# Patient Record
Sex: Male | Born: 1980
Health system: Southern US, Community
[De-identification: ages and names within clinical notes are randomized; demographics above are authoritative.]

## PROBLEM LIST (undated history)

## (undated) DIAGNOSIS — E079 Disorder of thyroid, unspecified: Secondary | ICD-10-CM

## (undated) DIAGNOSIS — F419 Anxiety disorder, unspecified: Secondary | ICD-10-CM

## (undated) DIAGNOSIS — F32A Depression, unspecified: Secondary | ICD-10-CM

## (undated) DIAGNOSIS — K219 Gastro-esophageal reflux disease without esophagitis: Secondary | ICD-10-CM

## (undated) DIAGNOSIS — B182 Chronic viral hepatitis C: Secondary | ICD-10-CM

## (undated) DIAGNOSIS — A048 Other specified bacterial intestinal infections: Secondary | ICD-10-CM

## (undated) DIAGNOSIS — F319 Bipolar disorder, unspecified: Secondary | ICD-10-CM

## (undated) DIAGNOSIS — E7800 Pure hypercholesterolemia, unspecified: Secondary | ICD-10-CM

## (undated) DIAGNOSIS — T50902A Poisoning by unspecified drugs, medicaments and biological substances, intentional self-harm, initial encounter: Secondary | ICD-10-CM

## (undated) DIAGNOSIS — C801 Malignant (primary) neoplasm, unspecified: Secondary | ICD-10-CM

## (undated) DIAGNOSIS — Z8709 Personal history of other diseases of the respiratory system: Secondary | ICD-10-CM

## (undated) DIAGNOSIS — F4310 Post-traumatic stress disorder, unspecified: Secondary | ICD-10-CM

## (undated) HISTORY — DX: Poisoning by unspecified drugs, medicaments and biological substances, intentional self-harm, initial encounter: T50.902A

## (undated) HISTORY — DX: Depression, unspecified: F32.A

## (undated) HISTORY — DX: Chronic viral hepatitis C: B18.2

## (undated) HISTORY — DX: Personal history of other diseases of the respiratory system: Z87.09

## (undated) HISTORY — DX: Bipolar disorder, unspecified: F31.9

## (undated) HISTORY — DX: Pure hypercholesterolemia, unspecified: E78.00

## (undated) HISTORY — DX: Gastro-esophageal reflux disease without esophagitis: K21.9

## (undated) HISTORY — DX: Other specified bacterial intestinal infections: A04.8

---

## 2001-05-07 ENCOUNTER — Inpatient Hospital Stay (HOSPITAL_COMMUNITY): Admission: EM | Admit: 2001-05-07 | Discharge: 2001-05-11 | Payer: Self-pay | Admitting: Psychiatry

## 2001-06-29 ENCOUNTER — Inpatient Hospital Stay (HOSPITAL_COMMUNITY): Admission: EM | Admit: 2001-06-29 | Discharge: 2001-07-06 | Payer: Self-pay | Admitting: Psychiatry

## 2001-08-04 ENCOUNTER — Inpatient Hospital Stay (HOSPITAL_COMMUNITY): Admission: EM | Admit: 2001-08-04 | Discharge: 2001-08-08 | Payer: Self-pay | Admitting: *Deleted

## 2001-08-07 ENCOUNTER — Encounter: Payer: Self-pay | Admitting: *Deleted

## 2010-11-19 HISTORY — PX: ESOPHAGOGASTRODUODENOSCOPY: SHX1529

## 2011-09-20 ENCOUNTER — Encounter (HOSPITAL_COMMUNITY): Payer: Self-pay | Admitting: Emergency Medicine

## 2011-09-20 ENCOUNTER — Emergency Department (HOSPITAL_COMMUNITY): Payer: No Typology Code available for payment source

## 2011-09-20 ENCOUNTER — Emergency Department (HOSPITAL_COMMUNITY)
Admission: EM | Admit: 2011-09-20 | Discharge: 2011-09-20 | Disposition: A | Discharge status: Home / Self Care | Payer: No Typology Code available for payment source | Attending: Emergency Medicine | Admitting: Emergency Medicine

## 2011-09-20 DIAGNOSIS — M545 Low back pain, unspecified: Secondary | ICD-10-CM | POA: Insufficient documentation

## 2011-09-20 DIAGNOSIS — Z79899 Other long term (current) drug therapy: Secondary | ICD-10-CM | POA: Insufficient documentation

## 2011-09-20 DIAGNOSIS — F17200 Nicotine dependence, unspecified, uncomplicated: Secondary | ICD-10-CM | POA: Insufficient documentation

## 2011-09-20 DIAGNOSIS — R413 Other amnesia: Secondary | ICD-10-CM | POA: Insufficient documentation

## 2011-09-20 DIAGNOSIS — R5383 Other fatigue: Secondary | ICD-10-CM | POA: Insufficient documentation

## 2011-09-20 DIAGNOSIS — M546 Pain in thoracic spine: Secondary | ICD-10-CM | POA: Insufficient documentation

## 2011-09-20 DIAGNOSIS — R5381 Other malaise: Secondary | ICD-10-CM | POA: Insufficient documentation

## 2011-09-20 DIAGNOSIS — M542 Cervicalgia: Secondary | ICD-10-CM | POA: Insufficient documentation

## 2011-09-20 DIAGNOSIS — F29 Unspecified psychosis not due to a substance or known physiological condition: Secondary | ICD-10-CM | POA: Insufficient documentation

## 2011-09-20 MED ORDER — OXYCODONE-ACETAMINOPHEN 5-325 MG PO TABS: 1.0000 | ORAL_TABLET | Freq: Four times a day (QID) | ORAL | Status: AC | PRN

## 2011-09-20 MED ORDER — HYDROCODONE-ACETAMINOPHEN 5-325 MG PO TABS
1.0000 | ORAL_TABLET | Freq: Once | ORAL | Status: AC
  Administered 2011-09-20: 1 via ORAL
  Filled 2011-09-20: qty 1
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
## 2011-09-20 NOTE — ED Notes (Signed)
Patient transported to X-ray 
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
## 2011-09-21 ENCOUNTER — Emergency Department (HOSPITAL_COMMUNITY): Payer: No Typology Code available for payment source

## 2011-09-21 ENCOUNTER — Encounter (HOSPITAL_COMMUNITY): Payer: Self-pay

## 2011-09-21 ENCOUNTER — Emergency Department (HOSPITAL_COMMUNITY)
Admission: EM | Admit: 2011-09-21 | Discharge: 2011-09-21 | Disposition: A | Discharge status: Home / Self Care | Payer: No Typology Code available for payment source | Attending: Emergency Medicine | Admitting: Emergency Medicine

## 2011-09-21 ENCOUNTER — Emergency Department (HOSPITAL_COMMUNITY)
Admission: EM | Admit: 2011-09-21 | Discharge: 2011-09-21 | Discharge status: Absent without leave | Payer: No Typology Code available for payment source | Source: Home / Self Care

## 2011-09-21 DIAGNOSIS — IMO0001 Reserved for inherently not codable concepts without codable children: Secondary | ICD-10-CM | POA: Insufficient documentation

## 2011-09-21 DIAGNOSIS — Z79899 Other long term (current) drug therapy: Secondary | ICD-10-CM | POA: Insufficient documentation

## 2011-09-21 DIAGNOSIS — Y92009 Unspecified place in unspecified non-institutional (private) residence as the place of occurrence of the external cause: Secondary | ICD-10-CM | POA: Insufficient documentation

## 2011-09-21 DIAGNOSIS — K8020 Calculus of gallbladder without cholecystitis without obstruction: Secondary | ICD-10-CM | POA: Insufficient documentation

## 2011-09-21 DIAGNOSIS — M545 Low back pain, unspecified: Secondary | ICD-10-CM

## 2011-09-21 DIAGNOSIS — M542 Cervicalgia: Secondary | ICD-10-CM | POA: Insufficient documentation

## 2011-09-21 DIAGNOSIS — F17200 Nicotine dependence, unspecified, uncomplicated: Secondary | ICD-10-CM | POA: Insufficient documentation

## 2011-09-21 DIAGNOSIS — R51 Headache: Secondary | ICD-10-CM | POA: Insufficient documentation

## 2011-09-21 DIAGNOSIS — R413 Other amnesia: Secondary | ICD-10-CM | POA: Insufficient documentation

## 2011-09-21 DIAGNOSIS — R42 Dizziness and giddiness: Secondary | ICD-10-CM | POA: Insufficient documentation

## 2011-09-21 DIAGNOSIS — W010XXA Fall on same level from slipping, tripping and stumbling without subsequent striking against object, initial encounter: Secondary | ICD-10-CM | POA: Insufficient documentation

## 2011-09-21 DIAGNOSIS — F0781 Postconcussional syndrome: Secondary | ICD-10-CM

## 2011-09-21 MED ORDER — OXYCODONE-ACETAMINOPHEN 5-325 MG PO TABS: 2.0000 | ORAL_TABLET | ORAL | Status: AC | PRN
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
## 2011-09-21 NOTE — Discharge Instructions (Signed)
Back Pain, Adult Low back pain is very common. About 1 in 5 people have back pain.The cause of low back pain is rarely dangerous. The pain often gets better over time.About half of people with a sudden onset of back pain feel better in just 2 weeks. About 8 in 10 people feel better by 6 weeks.  CAUSES Some common causes of back pain include:  Strain of the muscles or ligaments supporting the spine.   Wear and tear (degeneration) of the spinal discs.   Arthritis.   Direct injury to the back.  DIAGNOSIS Most of the time, the direct cause of low back pain is not known.However, back pain can be treated effectively even when the exact cause of the pain is unknown.Answering your caregiver's questions about your overall health and symptoms is one of the most accurate ways to make sure the cause of your pain is not dangerous. If your caregiver needs more information, he or she may order lab work or imaging tests (X-rays or MRIs).However, even if imaging tests show changes in your back, this usually does not require surgery. HOME CARE INSTRUCTIONS For many people, back pain returns.Since low back pain is rarely dangerous, it is often a condition that people can learn to manageon their own.   Remain active. It is stressful on the back to sit or stand in one place. Do not sit, drive, or stand in one place for more than 30 minutes at a time. Take short walks on level surfaces as soon as pain allows.Try to increase the length of time you walk each day.   Do not stay in bed.Resting more than 1 or 2 days can delay your recovery.   Do not avoid exercise or work.Your body is made to move.It is not dangerous to be active, even though your back may hurt.Your back will likely heal faster if you return to being active before your pain is gone.   Pay attention to your body when you bend and lift. Many people have less discomfortwhen lifting if they bend their knees, keep the load close to their  bodies,and avoid twisting. Often, the most comfortable positions are those that put less stress on your recovering back.   Find a comfortable position to sleep. Use a firm mattress and lie on your side with your knees slightly bent. If you lie on your back, put a pillow under your knees.   Only take over-the-counter or prescription medicines as directed by your caregiver. Over-the-counter medicines to reduce pain and inflammation are often the most helpful.Your caregiver may prescribe muscle relaxant drugs.These medicines help dull your pain so you can more quickly return to your normal activities and healthy exercise.   Put ice on the injured area.   Put ice in a plastic bag.   Place a towel between your skin and the bag.   Leave the ice on for 15 to 20 minutes, 3 to 4 times a day for the first 2 to 3 days. After that, ice and heat may be alternated to reduce pain and spasms.   Ask your caregiver about trying back exercises and gentle massage. This may be of some benefit.   Avoid feeling anxious or stressed.Stress increases muscle tension and can worsen back pain.It is important to recognize when you are anxious or stressed and learn ways to manage it.Exercise is a great option.  SEEK MEDICAL CARE IF:  You have pain that is not relieved with rest or medicine.   You have   pain that does not improve in 1 week.   You have new symptoms.   You are generally not feeling well.  SEEK IMMEDIATE MEDICAL CARE IF:   You have pain that radiates from your back into your legs.   You develop new bowel or bladder control problems.   You have unusual weakness or numbness in your arms or legs.   You develop nausea or vomiting.   You develop abdominal pain.   You feel faint.  Document Released: 06/20/2005 Document Revised: 06/09/2011 Document Reviewed: 11/08/2010 [employer] Patient Information 2012 [employer]. 

## 2011-09-21 NOTE — ED Provider Notes (Signed)
Medical screening examination/treatment/procedure(s) were performed by non-physician practitioner and as supervising physician I was immediately available for consultation/collaboration.  Laiah Pouncey L Artemis Koller, MD 09/21/11 1303 
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
## 2011-09-21 NOTE — ED Notes (Signed)
Patient transported to MRI 

## 2012-12-08 DIAGNOSIS — E7800 Pure hypercholesterolemia, unspecified: Secondary | ICD-10-CM | POA: Insufficient documentation

## 2012-12-08 DIAGNOSIS — F411 Generalized anxiety disorder: Secondary | ICD-10-CM | POA: Insufficient documentation

## 2013-01-03 ENCOUNTER — Encounter (HOSPITAL_COMMUNITY): Payer: Self-pay | Admitting: Emergency Medicine

## 2013-01-03 ENCOUNTER — Emergency Department (HOSPITAL_COMMUNITY)
Admission: EM | Admit: 2013-01-03 | Discharge: 2013-01-03 | Disposition: A | Discharge status: Home / Self Care | Payer: Medicare Other | Attending: Emergency Medicine | Admitting: Emergency Medicine

## 2013-01-03 DIAGNOSIS — G8929 Other chronic pain: Secondary | ICD-10-CM | POA: Insufficient documentation

## 2013-01-03 DIAGNOSIS — E079 Disorder of thyroid, unspecified: Secondary | ICD-10-CM | POA: Insufficient documentation

## 2013-01-03 DIAGNOSIS — Z79899 Other long term (current) drug therapy: Secondary | ICD-10-CM | POA: Insufficient documentation

## 2013-01-03 DIAGNOSIS — L255 Unspecified contact dermatitis due to plants, except food: Secondary | ICD-10-CM | POA: Insufficient documentation

## 2013-01-03 DIAGNOSIS — F17200 Nicotine dependence, unspecified, uncomplicated: Secondary | ICD-10-CM | POA: Insufficient documentation

## 2013-01-03 DIAGNOSIS — L237 Allergic contact dermatitis due to plants, except food: Secondary | ICD-10-CM

## 2013-01-03 DIAGNOSIS — F329 Major depressive disorder, single episode, unspecified: Secondary | ICD-10-CM | POA: Insufficient documentation

## 2013-01-03 DIAGNOSIS — M549 Dorsalgia, unspecified: Secondary | ICD-10-CM | POA: Insufficient documentation

## 2013-01-03 DIAGNOSIS — IMO0002 Reserved for concepts with insufficient information to code with codable children: Secondary | ICD-10-CM | POA: Insufficient documentation

## 2013-01-03 DIAGNOSIS — F4310 Post-traumatic stress disorder, unspecified: Secondary | ICD-10-CM | POA: Insufficient documentation

## 2013-01-03 DIAGNOSIS — F411 Generalized anxiety disorder: Secondary | ICD-10-CM | POA: Insufficient documentation

## 2013-01-03 DIAGNOSIS — F3289 Other specified depressive episodes: Secondary | ICD-10-CM | POA: Insufficient documentation

## 2013-01-03 HISTORY — DX: Disorder of thyroid, unspecified: E07.9

## 2013-01-03 HISTORY — DX: Post-traumatic stress disorder, unspecified: F43.10

## 2013-01-03 HISTORY — DX: Anxiety disorder, unspecified: F41.9

## 2013-01-03 MED ORDER — DIPHENHYDRAMINE HCL 25 MG PO TABS: 25.0000 mg | ORAL_TABLET | Freq: Four times a day (QID) | ORAL | Status: DC

## 2013-01-03 MED ORDER — PREDNISONE 10 MG PO TABS: 20.0000 mg | ORAL_TABLET | Freq: Two times a day (BID) | ORAL | Status: DC

## 2013-01-03 NOTE — ED Notes (Signed)
Pt reports rash to back area for 2 days,  Then to bil arms off/on for 1 month.  Stated has not been to his pmd for treatment of rash in the last month.   Denies any new lotions/soaps etc.  Pt is unsure of cause. Pt asked how long it would be until he was seen and if the ed was busy, he had a aunt who is sick upstairs.

## 2013-01-03 NOTE — ED Provider Notes (Signed)
Medical screening examination/treatment/procedure(s) were performed by non-physician practitioner and as supervising physician I was immediately available for consultation/collaboration.   Isabeau Mccalla B. Bernette Mayers, MD 01/03/13 1141
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
## 2013-01-03 NOTE — ED Notes (Signed)
States that he noticed a rash on his left arm and back several days ago.

## 2014-12-26 DIAGNOSIS — F39 Unspecified mood [affective] disorder: Secondary | ICD-10-CM | POA: Insufficient documentation

## 2015-07-21 DIAGNOSIS — R5383 Other fatigue: Secondary | ICD-10-CM | POA: Diagnosis not present

## 2015-07-21 DIAGNOSIS — B1920 Unspecified viral hepatitis C without hepatic coma: Secondary | ICD-10-CM | POA: Diagnosis not present

## 2015-07-21 DIAGNOSIS — F419 Anxiety disorder, unspecified: Secondary | ICD-10-CM | POA: Diagnosis not present

## 2015-07-21 DIAGNOSIS — E039 Hypothyroidism, unspecified: Secondary | ICD-10-CM | POA: Diagnosis not present

## 2015-07-21 DIAGNOSIS — G894 Chronic pain syndrome: Secondary | ICD-10-CM | POA: Diagnosis not present

## 2015-09-01 DIAGNOSIS — B182 Chronic viral hepatitis C: Secondary | ICD-10-CM | POA: Diagnosis not present

## 2015-10-12 DIAGNOSIS — J302 Other seasonal allergic rhinitis: Secondary | ICD-10-CM | POA: Diagnosis not present

## 2015-10-12 DIAGNOSIS — R51 Headache: Secondary | ICD-10-CM | POA: Diagnosis not present

## 2015-10-12 DIAGNOSIS — F419 Anxiety disorder, unspecified: Secondary | ICD-10-CM | POA: Diagnosis not present

## 2015-10-12 DIAGNOSIS — G894 Chronic pain syndrome: Secondary | ICD-10-CM | POA: Diagnosis not present

## 2015-10-16 DIAGNOSIS — S8980XA Other specified injuries of unspecified lower leg, initial encounter: Secondary | ICD-10-CM | POA: Diagnosis not present

## 2015-10-16 DIAGNOSIS — R51 Headache: Secondary | ICD-10-CM | POA: Diagnosis not present

## 2015-10-16 DIAGNOSIS — M545 Low back pain: Secondary | ICD-10-CM | POA: Diagnosis not present

## 2015-10-16 DIAGNOSIS — M542 Cervicalgia: Secondary | ICD-10-CM | POA: Diagnosis not present

## 2015-10-16 DIAGNOSIS — M25562 Pain in left knee: Secondary | ICD-10-CM | POA: Diagnosis not present

## 2016-01-28 DIAGNOSIS — B182 Chronic viral hepatitis C: Secondary | ICD-10-CM | POA: Diagnosis not present

## 2016-02-01 DIAGNOSIS — F4310 Post-traumatic stress disorder, unspecified: Secondary | ICD-10-CM | POA: Diagnosis not present

## 2016-02-01 DIAGNOSIS — Z79899 Other long term (current) drug therapy: Secondary | ICD-10-CM | POA: Diagnosis not present

## 2016-02-03 ENCOUNTER — Other Ambulatory Visit (HOSPITAL_COMMUNITY): Payer: Self-pay | Admitting: Gastroenterology

## 2016-02-03 DIAGNOSIS — B182 Chronic viral hepatitis C: Secondary | ICD-10-CM

## 2016-02-08 DIAGNOSIS — Z23 Encounter for immunization: Secondary | ICD-10-CM | POA: Diagnosis not present

## 2016-02-22 DIAGNOSIS — G894 Chronic pain syndrome: Secondary | ICD-10-CM | POA: Diagnosis not present

## 2016-02-22 DIAGNOSIS — F419 Anxiety disorder, unspecified: Secondary | ICD-10-CM | POA: Diagnosis not present

## 2016-02-23 DIAGNOSIS — F4310 Post-traumatic stress disorder, unspecified: Secondary | ICD-10-CM | POA: Diagnosis not present

## 2016-02-23 DIAGNOSIS — Z79899 Other long term (current) drug therapy: Secondary | ICD-10-CM | POA: Diagnosis not present

## 2016-03-15 ENCOUNTER — Ambulatory Visit (HOSPITAL_COMMUNITY): Payer: Medicare Other

## 2016-03-28 DIAGNOSIS — G894 Chronic pain syndrome: Secondary | ICD-10-CM | POA: Diagnosis not present

## 2016-04-04 DIAGNOSIS — F4310 Post-traumatic stress disorder, unspecified: Secondary | ICD-10-CM | POA: Diagnosis not present

## 2016-04-04 DIAGNOSIS — Z79899 Other long term (current) drug therapy: Secondary | ICD-10-CM | POA: Diagnosis not present

## 2016-04-27 ENCOUNTER — Ambulatory Visit (HOSPITAL_COMMUNITY)
Admission: RE | Admit: 2016-04-27 | Discharge: 2016-04-27 | Disposition: A | Discharge status: Home / Self Care | Payer: Medicare Other | Source: Ambulatory Visit | Attending: Gastroenterology | Admitting: Gastroenterology

## 2016-04-27 DIAGNOSIS — K8020 Calculus of gallbladder without cholecystitis without obstruction: Secondary | ICD-10-CM | POA: Diagnosis not present

## 2016-04-27 DIAGNOSIS — B182 Chronic viral hepatitis C: Secondary | ICD-10-CM | POA: Diagnosis not present

## 2016-04-27 DIAGNOSIS — B1920 Unspecified viral hepatitis C without hepatic coma: Secondary | ICD-10-CM | POA: Diagnosis not present

## 2016-05-03 DIAGNOSIS — K8020 Calculus of gallbladder without cholecystitis without obstruction: Secondary | ICD-10-CM | POA: Diagnosis not present

## 2016-05-03 DIAGNOSIS — B182 Chronic viral hepatitis C: Secondary | ICD-10-CM | POA: Diagnosis not present

## 2016-05-05 DIAGNOSIS — F4310 Post-traumatic stress disorder, unspecified: Secondary | ICD-10-CM | POA: Diagnosis not present

## 2016-05-05 DIAGNOSIS — Z79899 Other long term (current) drug therapy: Secondary | ICD-10-CM | POA: Diagnosis not present

## 2016-05-06 DIAGNOSIS — Z79899 Other long term (current) drug therapy: Secondary | ICD-10-CM | POA: Diagnosis not present

## 2016-05-06 DIAGNOSIS — F4310 Post-traumatic stress disorder, unspecified: Secondary | ICD-10-CM | POA: Diagnosis not present

## 2016-05-12 DIAGNOSIS — F4310 Post-traumatic stress disorder, unspecified: Secondary | ICD-10-CM | POA: Diagnosis not present

## 2016-05-12 DIAGNOSIS — Z79899 Other long term (current) drug therapy: Secondary | ICD-10-CM | POA: Diagnosis not present

## 2016-05-26 DIAGNOSIS — F329 Major depressive disorder, single episode, unspecified: Secondary | ICD-10-CM | POA: Diagnosis not present

## 2016-06-03 DIAGNOSIS — F4310 Post-traumatic stress disorder, unspecified: Secondary | ICD-10-CM | POA: Diagnosis not present

## 2016-06-03 DIAGNOSIS — Z79899 Other long term (current) drug therapy: Secondary | ICD-10-CM | POA: Diagnosis not present

## 2016-06-15 DIAGNOSIS — R112 Nausea with vomiting, unspecified: Secondary | ICD-10-CM | POA: Diagnosis not present

## 2016-06-15 DIAGNOSIS — R197 Diarrhea, unspecified: Secondary | ICD-10-CM | POA: Diagnosis not present

## 2016-06-15 DIAGNOSIS — B349 Viral infection, unspecified: Secondary | ICD-10-CM | POA: Diagnosis not present

## 2016-08-08 DIAGNOSIS — Z23 Encounter for immunization: Secondary | ICD-10-CM | POA: Diagnosis not present

## 2016-09-14 DIAGNOSIS — T50904A Poisoning by unspecified drugs, medicaments and biological substances, undetermined, initial encounter: Secondary | ICD-10-CM | POA: Diagnosis not present

## 2016-09-15 DIAGNOSIS — F1210 Cannabis abuse, uncomplicated: Secondary | ICD-10-CM | POA: Insufficient documentation

## 2016-09-15 DIAGNOSIS — F1010 Alcohol abuse, uncomplicated: Secondary | ICD-10-CM | POA: Insufficient documentation

## 2016-10-18 DIAGNOSIS — F319 Bipolar disorder, unspecified: Secondary | ICD-10-CM | POA: Diagnosis not present

## 2016-10-18 DIAGNOSIS — M1991 Primary osteoarthritis, unspecified site: Secondary | ICD-10-CM | POA: Diagnosis not present

## 2016-10-18 DIAGNOSIS — G894 Chronic pain syndrome: Secondary | ICD-10-CM | POA: Diagnosis not present

## 2016-10-18 DIAGNOSIS — F419 Anxiety disorder, unspecified: Secondary | ICD-10-CM | POA: Diagnosis not present

## 2016-10-18 DIAGNOSIS — K219 Gastro-esophageal reflux disease without esophagitis: Secondary | ICD-10-CM | POA: Diagnosis not present

## 2016-10-27 DIAGNOSIS — T1491XA Suicide attempt, initial encounter: Secondary | ICD-10-CM | POA: Diagnosis not present

## 2016-10-27 DIAGNOSIS — F4310 Post-traumatic stress disorder, unspecified: Secondary | ICD-10-CM | POA: Diagnosis not present

## 2016-10-27 DIAGNOSIS — F321 Major depressive disorder, single episode, moderate: Secondary | ICD-10-CM | POA: Diagnosis not present

## 2016-10-27 DIAGNOSIS — R45851 Suicidal ideations: Secondary | ICD-10-CM | POA: Diagnosis not present

## 2016-10-27 DIAGNOSIS — S41109A Unspecified open wound of unspecified upper arm, initial encounter: Secondary | ICD-10-CM | POA: Diagnosis not present

## 2016-10-27 DIAGNOSIS — F29 Unspecified psychosis not due to a substance or known physiological condition: Secondary | ICD-10-CM | POA: Diagnosis not present

## 2016-10-27 DIAGNOSIS — S61512A Laceration without foreign body of left wrist, initial encounter: Secondary | ICD-10-CM | POA: Diagnosis not present

## 2016-11-15 DIAGNOSIS — G894 Chronic pain syndrome: Secondary | ICD-10-CM | POA: Diagnosis not present

## 2016-11-15 DIAGNOSIS — M1991 Primary osteoarthritis, unspecified site: Secondary | ICD-10-CM | POA: Diagnosis not present

## 2016-11-15 DIAGNOSIS — G44029 Chronic cluster headache, not intractable: Secondary | ICD-10-CM | POA: Diagnosis not present

## 2016-11-15 DIAGNOSIS — F419 Anxiety disorder, unspecified: Secondary | ICD-10-CM | POA: Diagnosis not present

## 2016-11-17 DIAGNOSIS — G44029 Chronic cluster headache, not intractable: Secondary | ICD-10-CM | POA: Diagnosis not present

## 2016-11-17 DIAGNOSIS — M1380 Other specified arthritis, unspecified site: Secondary | ICD-10-CM | POA: Diagnosis not present

## 2016-11-17 DIAGNOSIS — F419 Anxiety disorder, unspecified: Secondary | ICD-10-CM | POA: Diagnosis not present

## 2016-11-17 DIAGNOSIS — F902 Attention-deficit hyperactivity disorder, combined type: Secondary | ICD-10-CM | POA: Diagnosis not present

## 2017-05-09 DIAGNOSIS — G894 Chronic pain syndrome: Secondary | ICD-10-CM | POA: Diagnosis not present

## 2017-06-22 DIAGNOSIS — S61212S Laceration without foreign body of right middle finger without damage to nail, sequela: Secondary | ICD-10-CM | POA: Diagnosis not present

## 2017-08-17 DIAGNOSIS — R200 Anesthesia of skin: Secondary | ICD-10-CM | POA: Diagnosis not present

## 2017-08-23 DIAGNOSIS — G5603 Carpal tunnel syndrome, bilateral upper limbs: Secondary | ICD-10-CM | POA: Diagnosis not present

## 2017-08-31 DIAGNOSIS — Z79899 Other long term (current) drug therapy: Secondary | ICD-10-CM | POA: Diagnosis not present

## 2017-08-31 DIAGNOSIS — F3181 Bipolar II disorder: Secondary | ICD-10-CM | POA: Diagnosis not present

## 2017-08-31 DIAGNOSIS — G894 Chronic pain syndrome: Secondary | ICD-10-CM | POA: Diagnosis not present

## 2017-08-31 DIAGNOSIS — G5602 Carpal tunnel syndrome, left upper limb: Secondary | ICD-10-CM | POA: Diagnosis not present

## 2017-08-31 DIAGNOSIS — G5601 Carpal tunnel syndrome, right upper limb: Secondary | ICD-10-CM | POA: Diagnosis not present

## 2017-08-31 DIAGNOSIS — F900 Attention-deficit hyperactivity disorder, predominantly inattentive type: Secondary | ICD-10-CM | POA: Diagnosis not present

## 2017-08-31 DIAGNOSIS — Z79891 Long term (current) use of opiate analgesic: Secondary | ICD-10-CM | POA: Diagnosis not present

## 2017-08-31 DIAGNOSIS — M5417 Radiculopathy, lumbosacral region: Secondary | ICD-10-CM | POA: Diagnosis not present

## 2017-08-31 DIAGNOSIS — F411 Generalized anxiety disorder: Secondary | ICD-10-CM | POA: Diagnosis not present

## 2017-09-14 DIAGNOSIS — F411 Generalized anxiety disorder: Secondary | ICD-10-CM | POA: Diagnosis not present

## 2017-09-14 DIAGNOSIS — Z79891 Long term (current) use of opiate analgesic: Secondary | ICD-10-CM | POA: Diagnosis not present

## 2017-09-14 DIAGNOSIS — G894 Chronic pain syndrome: Secondary | ICD-10-CM | POA: Diagnosis not present

## 2017-09-14 DIAGNOSIS — M5417 Radiculopathy, lumbosacral region: Secondary | ICD-10-CM | POA: Diagnosis not present

## 2017-09-14 DIAGNOSIS — F900 Attention-deficit hyperactivity disorder, predominantly inattentive type: Secondary | ICD-10-CM | POA: Diagnosis not present

## 2017-09-14 DIAGNOSIS — G5601 Carpal tunnel syndrome, right upper limb: Secondary | ICD-10-CM | POA: Diagnosis not present

## 2017-09-14 DIAGNOSIS — G5602 Carpal tunnel syndrome, left upper limb: Secondary | ICD-10-CM | POA: Diagnosis not present

## 2017-09-14 DIAGNOSIS — F3181 Bipolar II disorder: Secondary | ICD-10-CM | POA: Diagnosis not present

## 2017-09-22 DIAGNOSIS — F310 Bipolar disorder, current episode hypomanic: Secondary | ICD-10-CM | POA: Diagnosis not present

## 2017-09-22 DIAGNOSIS — Z79899 Other long term (current) drug therapy: Secondary | ICD-10-CM | POA: Diagnosis not present

## 2017-09-27 DIAGNOSIS — R200 Anesthesia of skin: Secondary | ICD-10-CM | POA: Diagnosis not present

## 2017-09-27 DIAGNOSIS — G5602 Carpal tunnel syndrome, left upper limb: Secondary | ICD-10-CM | POA: Diagnosis not present

## 2017-10-05 DIAGNOSIS — M5417 Radiculopathy, lumbosacral region: Secondary | ICD-10-CM | POA: Diagnosis not present

## 2017-10-05 DIAGNOSIS — F3181 Bipolar II disorder: Secondary | ICD-10-CM | POA: Diagnosis not present

## 2017-10-05 DIAGNOSIS — G894 Chronic pain syndrome: Secondary | ICD-10-CM | POA: Diagnosis not present

## 2017-10-05 DIAGNOSIS — G5601 Carpal tunnel syndrome, right upper limb: Secondary | ICD-10-CM | POA: Diagnosis not present

## 2017-10-05 DIAGNOSIS — F411 Generalized anxiety disorder: Secondary | ICD-10-CM | POA: Diagnosis not present

## 2017-10-05 DIAGNOSIS — F900 Attention-deficit hyperactivity disorder, predominantly inattentive type: Secondary | ICD-10-CM | POA: Diagnosis not present

## 2017-10-05 DIAGNOSIS — G5602 Carpal tunnel syndrome, left upper limb: Secondary | ICD-10-CM | POA: Diagnosis not present

## 2017-10-05 DIAGNOSIS — Z79891 Long term (current) use of opiate analgesic: Secondary | ICD-10-CM | POA: Diagnosis not present

## 2017-10-06 DIAGNOSIS — F319 Bipolar disorder, unspecified: Secondary | ICD-10-CM | POA: Diagnosis not present

## 2017-10-06 DIAGNOSIS — G5602 Carpal tunnel syndrome, left upper limb: Secondary | ICD-10-CM | POA: Diagnosis not present

## 2017-10-06 DIAGNOSIS — F17290 Nicotine dependence, other tobacco product, uncomplicated: Secondary | ICD-10-CM | POA: Diagnosis not present

## 2017-10-06 DIAGNOSIS — Z79899 Other long term (current) drug therapy: Secondary | ICD-10-CM | POA: Diagnosis not present

## 2017-10-06 DIAGNOSIS — F909 Attention-deficit hyperactivity disorder, unspecified type: Secondary | ICD-10-CM | POA: Diagnosis not present

## 2017-10-06 DIAGNOSIS — F419 Anxiety disorder, unspecified: Secondary | ICD-10-CM | POA: Diagnosis not present

## 2017-10-06 DIAGNOSIS — E079 Disorder of thyroid, unspecified: Secondary | ICD-10-CM | POA: Diagnosis not present

## 2017-10-13 DIAGNOSIS — G5602 Carpal tunnel syndrome, left upper limb: Secondary | ICD-10-CM | POA: Diagnosis not present

## 2017-10-13 DIAGNOSIS — F411 Generalized anxiety disorder: Secondary | ICD-10-CM | POA: Diagnosis not present

## 2017-10-13 DIAGNOSIS — G894 Chronic pain syndrome: Secondary | ICD-10-CM | POA: Diagnosis not present

## 2017-10-13 DIAGNOSIS — F900 Attention-deficit hyperactivity disorder, predominantly inattentive type: Secondary | ICD-10-CM | POA: Diagnosis not present

## 2017-10-13 DIAGNOSIS — G5601 Carpal tunnel syndrome, right upper limb: Secondary | ICD-10-CM | POA: Diagnosis not present

## 2017-10-13 DIAGNOSIS — F3181 Bipolar II disorder: Secondary | ICD-10-CM | POA: Diagnosis not present

## 2017-10-13 DIAGNOSIS — Z79891 Long term (current) use of opiate analgesic: Secondary | ICD-10-CM | POA: Diagnosis not present

## 2017-10-13 DIAGNOSIS — M5417 Radiculopathy, lumbosacral region: Secondary | ICD-10-CM | POA: Diagnosis not present

## 2017-10-16 DIAGNOSIS — G894 Chronic pain syndrome: Secondary | ICD-10-CM | POA: Diagnosis not present

## 2017-10-16 DIAGNOSIS — Z79891 Long term (current) use of opiate analgesic: Secondary | ICD-10-CM | POA: Diagnosis not present

## 2017-10-16 DIAGNOSIS — F900 Attention-deficit hyperactivity disorder, predominantly inattentive type: Secondary | ICD-10-CM | POA: Diagnosis not present

## 2017-10-16 DIAGNOSIS — F411 Generalized anxiety disorder: Secondary | ICD-10-CM | POA: Diagnosis not present

## 2017-10-16 DIAGNOSIS — G5601 Carpal tunnel syndrome, right upper limb: Secondary | ICD-10-CM | POA: Diagnosis not present

## 2017-10-16 DIAGNOSIS — G5602 Carpal tunnel syndrome, left upper limb: Secondary | ICD-10-CM | POA: Diagnosis not present

## 2017-10-16 DIAGNOSIS — M5417 Radiculopathy, lumbosacral region: Secondary | ICD-10-CM | POA: Diagnosis not present

## 2017-10-16 DIAGNOSIS — F3181 Bipolar II disorder: Secondary | ICD-10-CM | POA: Diagnosis not present

## 2017-11-30 DIAGNOSIS — F988 Other specified behavioral and emotional disorders with onset usually occurring in childhood and adolescence: Secondary | ICD-10-CM | POA: Diagnosis not present

## 2017-11-30 DIAGNOSIS — Z1331 Encounter for screening for depression: Secondary | ICD-10-CM | POA: Diagnosis not present

## 2017-11-30 DIAGNOSIS — Z6825 Body mass index (BMI) 25.0-25.9, adult: Secondary | ICD-10-CM | POA: Diagnosis not present

## 2017-12-31 DIAGNOSIS — S40022A Contusion of left upper arm, initial encounter: Secondary | ICD-10-CM | POA: Diagnosis not present

## 2017-12-31 DIAGNOSIS — R51 Headache: Secondary | ICD-10-CM | POA: Diagnosis not present

## 2017-12-31 DIAGNOSIS — F17210 Nicotine dependence, cigarettes, uncomplicated: Secondary | ICD-10-CM | POA: Diagnosis not present

## 2017-12-31 DIAGNOSIS — Z79899 Other long term (current) drug therapy: Secondary | ICD-10-CM | POA: Diagnosis not present

## 2017-12-31 DIAGNOSIS — S0990XA Unspecified injury of head, initial encounter: Secondary | ICD-10-CM | POA: Diagnosis not present

## 2017-12-31 DIAGNOSIS — J45909 Unspecified asthma, uncomplicated: Secondary | ICD-10-CM | POA: Diagnosis not present

## 2017-12-31 DIAGNOSIS — T7411XA Adult physical abuse, confirmed, initial encounter: Secondary | ICD-10-CM | POA: Diagnosis not present

## 2018-02-06 DIAGNOSIS — R52 Pain, unspecified: Secondary | ICD-10-CM | POA: Diagnosis not present

## 2018-02-06 DIAGNOSIS — R0602 Shortness of breath: Secondary | ICD-10-CM | POA: Diagnosis not present

## 2018-02-06 DIAGNOSIS — F1910 Other psychoactive substance abuse, uncomplicated: Secondary | ICD-10-CM

## 2018-02-06 DIAGNOSIS — I38 Endocarditis, valve unspecified: Secondary | ICD-10-CM | POA: Diagnosis not present

## 2018-02-06 DIAGNOSIS — E162 Hypoglycemia, unspecified: Secondary | ICD-10-CM | POA: Diagnosis not present

## 2018-02-06 DIAGNOSIS — G9341 Metabolic encephalopathy: Secondary | ICD-10-CM

## 2018-02-06 DIAGNOSIS — R0902 Hypoxemia: Secondary | ICD-10-CM | POA: Diagnosis not present

## 2018-02-06 DIAGNOSIS — T50904A Poisoning by unspecified drugs, medicaments and biological substances, undetermined, initial encounter: Secondary | ICD-10-CM | POA: Diagnosis not present

## 2018-02-06 DIAGNOSIS — R404 Transient alteration of awareness: Secondary | ICD-10-CM | POA: Diagnosis not present

## 2018-02-06 DIAGNOSIS — T68XXXA Hypothermia, initial encounter: Secondary | ICD-10-CM | POA: Diagnosis not present

## 2018-02-06 DIAGNOSIS — R4020 Unspecified coma: Secondary | ICD-10-CM | POA: Diagnosis not present

## 2018-02-06 DIAGNOSIS — T50901A Poisoning by unspecified drugs, medicaments and biological substances, accidental (unintentional), initial encounter: Secondary | ICD-10-CM | POA: Diagnosis not present

## 2018-02-06 DIAGNOSIS — R4182 Altered mental status, unspecified: Secondary | ICD-10-CM | POA: Diagnosis not present

## 2018-02-06 DIAGNOSIS — T40601A Poisoning by unspecified narcotics, accidental (unintentional), initial encounter: Secondary | ICD-10-CM | POA: Diagnosis not present

## 2018-02-06 DIAGNOSIS — I959 Hypotension, unspecified: Secondary | ICD-10-CM | POA: Diagnosis not present

## 2018-02-07 DIAGNOSIS — F1910 Other psychoactive substance abuse, uncomplicated: Secondary | ICD-10-CM | POA: Diagnosis not present

## 2018-02-07 DIAGNOSIS — G9341 Metabolic encephalopathy: Secondary | ICD-10-CM | POA: Diagnosis not present

## 2018-02-07 DIAGNOSIS — I425 Other restrictive cardiomyopathy: Secondary | ICD-10-CM | POA: Diagnosis not present

## 2018-02-08 DIAGNOSIS — I5020 Unspecified systolic (congestive) heart failure: Secondary | ICD-10-CM | POA: Diagnosis not present

## 2018-02-08 DIAGNOSIS — G92 Toxic encephalopathy: Secondary | ICD-10-CM | POA: Diagnosis not present

## 2018-02-08 DIAGNOSIS — I2721 Secondary pulmonary arterial hypertension: Secondary | ICD-10-CM | POA: Diagnosis not present

## 2018-02-08 DIAGNOSIS — F1910 Other psychoactive substance abuse, uncomplicated: Secondary | ICD-10-CM | POA: Diagnosis not present

## 2018-02-08 DIAGNOSIS — T50904A Poisoning by unspecified drugs, medicaments and biological substances, undetermined, initial encounter: Secondary | ICD-10-CM | POA: Diagnosis not present

## 2018-02-08 DIAGNOSIS — T40601A Poisoning by unspecified narcotics, accidental (unintentional), initial encounter: Secondary | ICD-10-CM | POA: Diagnosis not present

## 2018-03-24 DIAGNOSIS — K610 Anal abscess: Secondary | ICD-10-CM | POA: Diagnosis not present

## 2018-03-24 DIAGNOSIS — K611 Rectal abscess: Secondary | ICD-10-CM | POA: Diagnosis not present

## 2018-03-24 DIAGNOSIS — R5381 Other malaise: Secondary | ICD-10-CM | POA: Diagnosis not present

## 2018-03-24 DIAGNOSIS — L02415 Cutaneous abscess of right lower limb: Secondary | ICD-10-CM | POA: Diagnosis not present

## 2018-03-24 DIAGNOSIS — F17210 Nicotine dependence, cigarettes, uncomplicated: Secondary | ICD-10-CM | POA: Diagnosis not present

## 2018-04-18 DIAGNOSIS — J45909 Unspecified asthma, uncomplicated: Secondary | ICD-10-CM | POA: Diagnosis not present

## 2018-04-18 DIAGNOSIS — M79671 Pain in right foot: Secondary | ICD-10-CM | POA: Diagnosis not present

## 2018-04-18 DIAGNOSIS — Z79891 Long term (current) use of opiate analgesic: Secondary | ICD-10-CM | POA: Diagnosis not present

## 2018-04-18 DIAGNOSIS — M25571 Pain in right ankle and joints of right foot: Secondary | ICD-10-CM | POA: Diagnosis not present

## 2018-04-18 DIAGNOSIS — S99911A Unspecified injury of right ankle, initial encounter: Secondary | ICD-10-CM | POA: Diagnosis not present

## 2018-04-18 DIAGNOSIS — S99921A Unspecified injury of right foot, initial encounter: Secondary | ICD-10-CM | POA: Diagnosis not present

## 2018-04-18 DIAGNOSIS — E039 Hypothyroidism, unspecified: Secondary | ICD-10-CM | POA: Diagnosis not present

## 2018-04-18 DIAGNOSIS — Z79899 Other long term (current) drug therapy: Secondary | ICD-10-CM | POA: Diagnosis not present

## 2018-04-18 DIAGNOSIS — F17210 Nicotine dependence, cigarettes, uncomplicated: Secondary | ICD-10-CM | POA: Diagnosis not present

## 2018-04-18 DIAGNOSIS — G8929 Other chronic pain: Secondary | ICD-10-CM | POA: Diagnosis not present

## 2018-04-19 DIAGNOSIS — M25571 Pain in right ankle and joints of right foot: Secondary | ICD-10-CM | POA: Diagnosis not present

## 2018-05-04 DIAGNOSIS — E039 Hypothyroidism, unspecified: Secondary | ICD-10-CM | POA: Diagnosis not present

## 2018-05-04 DIAGNOSIS — M1990 Unspecified osteoarthritis, unspecified site: Secondary | ICD-10-CM | POA: Diagnosis not present

## 2018-07-09 DIAGNOSIS — Z6825 Body mass index (BMI) 25.0-25.9, adult: Secondary | ICD-10-CM | POA: Diagnosis not present

## 2018-07-09 DIAGNOSIS — F988 Other specified behavioral and emotional disorders with onset usually occurring in childhood and adolescence: Secondary | ICD-10-CM | POA: Diagnosis not present

## 2018-07-09 DIAGNOSIS — F411 Generalized anxiety disorder: Secondary | ICD-10-CM | POA: Diagnosis not present

## 2018-09-04 DIAGNOSIS — F988 Other specified behavioral and emotional disorders with onset usually occurring in childhood and adolescence: Secondary | ICD-10-CM | POA: Diagnosis not present

## 2018-09-04 DIAGNOSIS — Z6825 Body mass index (BMI) 25.0-25.9, adult: Secondary | ICD-10-CM | POA: Diagnosis not present

## 2018-09-04 DIAGNOSIS — R51 Headache: Secondary | ICD-10-CM | POA: Diagnosis not present

## 2018-09-04 DIAGNOSIS — Z23 Encounter for immunization: Secondary | ICD-10-CM | POA: Diagnosis not present

## 2018-09-20 DIAGNOSIS — Z79899 Other long term (current) drug therapy: Secondary | ICD-10-CM | POA: Diagnosis not present

## 2018-09-20 DIAGNOSIS — Z6825 Body mass index (BMI) 25.0-25.9, adult: Secondary | ICD-10-CM | POA: Diagnosis not present

## 2018-12-13 ENCOUNTER — Other Ambulatory Visit: Payer: Self-pay

## 2018-12-13 ENCOUNTER — Emergency Department (HOSPITAL_COMMUNITY)
Admission: EM | Admit: 2018-12-13 | Discharge: 2018-12-13 | Disposition: A | Discharge status: Home / Self Care | Payer: Medicare Other | Attending: Emergency Medicine | Admitting: Emergency Medicine

## 2018-12-13 DIAGNOSIS — X010XXA Exposure to flames in uncontrolled fire, not in building or structure, initial encounter: Secondary | ICD-10-CM | POA: Insufficient documentation

## 2018-12-13 DIAGNOSIS — T22131A Burn of first degree of right upper arm, initial encounter: Secondary | ICD-10-CM | POA: Diagnosis not present

## 2018-12-13 DIAGNOSIS — Y92017 Garden or yard in single-family (private) house as the place of occurrence of the external cause: Secondary | ICD-10-CM | POA: Insufficient documentation

## 2018-12-13 DIAGNOSIS — Y93H2 Activity, gardening and landscaping: Secondary | ICD-10-CM | POA: Insufficient documentation

## 2018-12-13 DIAGNOSIS — F17210 Nicotine dependence, cigarettes, uncomplicated: Secondary | ICD-10-CM | POA: Insufficient documentation

## 2018-12-13 DIAGNOSIS — T22111A Burn of first degree of right forearm, initial encounter: Secondary | ICD-10-CM | POA: Insufficient documentation

## 2018-12-13 DIAGNOSIS — R52 Pain, unspecified: Secondary | ICD-10-CM | POA: Diagnosis not present

## 2018-12-13 DIAGNOSIS — Y999 Unspecified external cause status: Secondary | ICD-10-CM | POA: Insufficient documentation

## 2018-12-13 DIAGNOSIS — R609 Edema, unspecified: Secondary | ICD-10-CM | POA: Diagnosis not present

## 2018-12-13 DIAGNOSIS — R000 Tachycardia, unspecified: Secondary | ICD-10-CM | POA: Diagnosis not present

## 2018-12-13 DIAGNOSIS — R61 Generalized hyperhidrosis: Secondary | ICD-10-CM | POA: Diagnosis not present

## 2018-12-13 DIAGNOSIS — I10 Essential (primary) hypertension: Secondary | ICD-10-CM | POA: Diagnosis not present

## 2018-12-13 DIAGNOSIS — T300 Burn of unspecified body region, unspecified degree: Secondary | ICD-10-CM

## 2018-12-13 MED ORDER — SILVER SULFADIAZINE 1 % EX CREA
TOPICAL_CREAM | Freq: Once | CUTANEOUS | Status: AC
  Administered 2018-12-13: 1 via TOPICAL
  Filled 2018-12-13: qty 85

## 2018-12-13 MED ORDER — MORPHINE SULFATE (PF) 4 MG/ML IV SOLN
4.0000 mg | Freq: Once | INTRAVENOUS | Status: AC
  Administered 2018-12-13: 4 mg via INTRAVENOUS
  Filled 2018-12-13: qty 1

## 2018-12-13 MED ORDER — ONDANSETRON HCL 4 MG/2ML IJ SOLN
4.0000 mg | Freq: Once | INTRAMUSCULAR | Status: AC
  Administered 2018-12-13: 4 mg via INTRAVENOUS
  Filled 2018-12-13: qty 2

## 2018-12-13 MED ORDER — IBUPROFEN 800 MG PO TABS: 800.0000 mg | ORAL_TABLET | Freq: Three times a day (TID) | ORAL | 0 refills | Status: AC

## 2018-12-13 MED ORDER — SILVER SULFADIAZINE 1 % EX CREA: 1.0000 "application " | TOPICAL_CREAM | Freq: Every day | CUTANEOUS | 0 refills | Status: DC

## 2018-12-13 MED ORDER — FENTANYL CITRATE (PF) 100 MCG/2ML IJ SOLN
100.0000 ug | Freq: Once | INTRAMUSCULAR | Status: AC
  Administered 2018-12-13: 100 ug via INTRAVENOUS
  Filled 2018-12-13: qty 2
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
## 2019-03-12 DIAGNOSIS — M7701 Medial epicondylitis, right elbow: Secondary | ICD-10-CM | POA: Diagnosis not present

## 2019-03-12 DIAGNOSIS — F419 Anxiety disorder, unspecified: Secondary | ICD-10-CM | POA: Diagnosis not present

## 2019-03-19 DIAGNOSIS — R799 Abnormal finding of blood chemistry, unspecified: Secondary | ICD-10-CM | POA: Diagnosis not present

## 2019-04-01 DIAGNOSIS — M25521 Pain in right elbow: Secondary | ICD-10-CM | POA: Diagnosis not present

## 2019-04-01 DIAGNOSIS — M7701 Medial epicondylitis, right elbow: Secondary | ICD-10-CM | POA: Diagnosis not present

## 2019-04-16 DIAGNOSIS — G5601 Carpal tunnel syndrome, right upper limb: Secondary | ICD-10-CM | POA: Diagnosis not present

## 2019-05-03 DIAGNOSIS — G5601 Carpal tunnel syndrome, right upper limb: Secondary | ICD-10-CM | POA: Diagnosis not present

## 2019-05-03 DIAGNOSIS — J45909 Unspecified asthma, uncomplicated: Secondary | ICD-10-CM | POA: Diagnosis not present

## 2019-05-03 DIAGNOSIS — F17210 Nicotine dependence, cigarettes, uncomplicated: Secondary | ICD-10-CM | POA: Diagnosis not present

## 2020-10-12 DIAGNOSIS — Z23 Encounter for immunization: Secondary | ICD-10-CM | POA: Diagnosis not present

## 2020-10-12 DIAGNOSIS — S61011A Laceration without foreign body of right thumb without damage to nail, initial encounter: Secondary | ICD-10-CM | POA: Diagnosis not present

## 2020-10-12 DIAGNOSIS — R58 Hemorrhage, not elsewhere classified: Secondary | ICD-10-CM | POA: Diagnosis not present

## 2020-10-12 DIAGNOSIS — R457 State of emotional shock and stress, unspecified: Secondary | ICD-10-CM | POA: Diagnosis not present

## 2020-10-12 DIAGNOSIS — R000 Tachycardia, unspecified: Secondary | ICD-10-CM | POA: Diagnosis not present

## 2020-10-12 DIAGNOSIS — R0689 Other abnormalities of breathing: Secondary | ICD-10-CM | POA: Diagnosis not present

## 2020-10-22 DIAGNOSIS — Z79899 Other long term (current) drug therapy: Secondary | ICD-10-CM | POA: Diagnosis not present

## 2020-10-22 DIAGNOSIS — Z7982 Long term (current) use of aspirin: Secondary | ICD-10-CM | POA: Diagnosis not present

## 2020-10-22 DIAGNOSIS — Z885 Allergy status to narcotic agent status: Secondary | ICD-10-CM | POA: Diagnosis not present

## 2020-10-22 DIAGNOSIS — Z7901 Long term (current) use of anticoagulants: Secondary | ICD-10-CM | POA: Diagnosis not present

## 2020-10-22 DIAGNOSIS — F17210 Nicotine dependence, cigarettes, uncomplicated: Secondary | ICD-10-CM | POA: Diagnosis not present

## 2020-10-22 DIAGNOSIS — Z5189 Encounter for other specified aftercare: Secondary | ICD-10-CM | POA: Diagnosis not present

## 2020-11-10 DIAGNOSIS — F419 Anxiety disorder, unspecified: Secondary | ICD-10-CM | POA: Diagnosis not present

## 2020-11-10 DIAGNOSIS — E039 Hypothyroidism, unspecified: Secondary | ICD-10-CM | POA: Diagnosis not present

## 2020-11-10 DIAGNOSIS — Z13220 Encounter for screening for lipoid disorders: Secondary | ICD-10-CM | POA: Diagnosis not present

## 2021-01-20 DIAGNOSIS — R52 Pain, unspecified: Secondary | ICD-10-CM | POA: Diagnosis not present

## 2021-01-20 DIAGNOSIS — E7800 Pure hypercholesterolemia, unspecified: Secondary | ICD-10-CM | POA: Diagnosis not present

## 2021-01-20 DIAGNOSIS — R1010 Upper abdominal pain, unspecified: Secondary | ICD-10-CM | POA: Diagnosis not present

## 2021-01-20 DIAGNOSIS — R911 Solitary pulmonary nodule: Secondary | ICD-10-CM | POA: Diagnosis not present

## 2021-01-20 DIAGNOSIS — R079 Chest pain, unspecified: Secondary | ICD-10-CM | POA: Diagnosis not present

## 2021-01-20 DIAGNOSIS — M1990 Unspecified osteoarthritis, unspecified site: Secondary | ICD-10-CM | POA: Diagnosis not present

## 2021-01-20 DIAGNOSIS — R1084 Generalized abdominal pain: Secondary | ICD-10-CM | POA: Diagnosis not present

## 2021-01-20 DIAGNOSIS — J45909 Unspecified asthma, uncomplicated: Secondary | ICD-10-CM | POA: Diagnosis not present

## 2021-01-20 DIAGNOSIS — I10 Essential (primary) hypertension: Secondary | ICD-10-CM | POA: Diagnosis not present

## 2021-01-20 DIAGNOSIS — F17210 Nicotine dependence, cigarettes, uncomplicated: Secondary | ICD-10-CM | POA: Diagnosis not present

## 2021-01-20 DIAGNOSIS — R000 Tachycardia, unspecified: Secondary | ICD-10-CM | POA: Diagnosis not present

## 2021-01-20 DIAGNOSIS — R0789 Other chest pain: Secondary | ICD-10-CM | POA: Diagnosis not present

## 2021-01-20 DIAGNOSIS — R109 Unspecified abdominal pain: Secondary | ICD-10-CM | POA: Diagnosis not present

## 2021-01-29 ENCOUNTER — Other Ambulatory Visit: Payer: Self-pay

## 2021-01-29 ENCOUNTER — Encounter (HOSPITAL_COMMUNITY): Payer: Self-pay

## 2021-01-29 ENCOUNTER — Emergency Department (HOSPITAL_COMMUNITY)
Admission: EM | Admit: 2021-01-29 | Discharge: 2021-01-29 | Disposition: A | Discharge status: Home / Self Care | Payer: Medicare Other | Attending: Physician Assistant | Admitting: Physician Assistant

## 2021-01-29 DIAGNOSIS — R079 Chest pain, unspecified: Secondary | ICD-10-CM | POA: Insufficient documentation

## 2021-01-29 DIAGNOSIS — R109 Unspecified abdominal pain: Secondary | ICD-10-CM | POA: Insufficient documentation

## 2021-01-29 DIAGNOSIS — Z5321 Procedure and treatment not carried out due to patient leaving prior to being seen by health care provider: Secondary | ICD-10-CM | POA: Insufficient documentation

## 2021-01-29 LAB — COMPREHENSIVE METABOLIC PANEL
ALT: 13 U/L (ref 0–44)
AST: 19 U/L (ref 15–41)
Albumin: 4.3 g/dL (ref 3.5–5.0)
Alkaline Phosphatase: 81 U/L (ref 38–126)
Anion gap: 10 (ref 5–15)
BUN: 13 mg/dL (ref 6–20)
CO2: 24 mmol/L (ref 22–32)
Calcium: 9.6 mg/dL (ref 8.9–10.3)
Chloride: 101 mmol/L (ref 98–111)
Creatinine, Ser: 0.88 mg/dL (ref 0.61–1.24)
GFR, Estimated: >60 mL/min (ref >60)
Glucose, Bld: 126 mg/dL — ABNORMAL HIGH (ref 70–99)
Potassium: 3.5 mmol/L (ref 3.5–5.1)
Sodium: 135 mmol/L (ref 135–145)
Total Bilirubin: 0.8 mg/dL (ref 0.3–1.2)
Total Protein: 7.3 g/dL (ref 6.5–8.1)

## 2021-01-29 LAB — CBC WITH DIFFERENTIAL/PLATELET
Abs Immature Granulocytes: 0.01 10*3/uL (ref 0.00–0.07)
Basophils Absolute: 0 10*3/uL (ref 0.0–0.1)
Basophils Relative: 0 %
Eosinophils Absolute: 0.1 10*3/uL (ref 0.0–0.5)
Eosinophils Relative: 1 %
HCT: 44 % (ref 39.0–52.0)
Hemoglobin: 15.2 g/dL (ref 13.0–17.0)
Immature Granulocytes: 0 %
Lymphocytes Relative: 27 %
Lymphs Abs: 2.1 10*3/uL (ref 0.7–4.0)
MCH: 31.9 pg (ref 26.0–34.0)
MCHC: 34.5 g/dL (ref 30.0–36.0)
MCV: 92.2 fL (ref 80.0–100.0)
Monocytes Absolute: 0.4 10*3/uL (ref 0.1–1.0)
Monocytes Relative: 5 %
Neutro Abs: 5.1 10*3/uL (ref 1.7–7.7)
Neutrophils Relative %: 67 %
Platelets: 308 10*3/uL (ref 150–400)
RBC: 4.77 MIL/uL (ref 4.22–5.81)
RDW: 12 % (ref 11.5–15.5)
WBC: 7.7 10*3/uL (ref 4.0–10.5)
nRBC: 0 % (ref 0.0–0.2)

## 2021-01-29 LAB — LIPASE, BLOOD: Lipase: 754 U/L — ABNORMAL HIGH (ref 11–51)

## 2021-01-29 MED ORDER — IBUPROFEN 200 MG PO TABS
600.0000 mg | ORAL_TABLET | Freq: Once | ORAL | Status: AC
  Administered 2021-01-29: 600 mg via ORAL
  Filled 2021-01-29: qty 1

## 2021-01-29 NOTE — ED Triage Notes (Signed)
Pt reports left sided flank pain for the past week, pt seen at  [facility] but c.o chest pain, no workup on abd done. Pt denies hematuria, n.v

## 2021-01-29 NOTE — ED Notes (Signed)
Patient was called for vitals, patient stated that he was going to leave, patient was encouraged to stay but left the emergency room.

## 2021-01-29 NOTE — ED Provider Notes (Signed)
Emergency Medicine Provider Triage Evaluation Note  Tony Elliott , a 40 y.o. male  was evaluated in triage.  Pt complains of left abd pain. States pain started a few days ago but was in his chest. Seen at [facility] and had cxr and ct chest which he states were unremarkable. Denies nvd.  Review of Systems  Positive: Left abd pain Negative: nvd  Physical Exam  BP (!) 149/109   Pulse (!) 123   Temp 98.8 F (37.1 C) (Oral)   Resp 18   SpO2 100%  Gen:   Awake, no distress   Resp:  Normal effort  MSK:   Moves extremities without difficulty  Other:    Medical Decision Making  Medically screening exam initiated at 2:31 PM.  Appropriate orders placed.  Adonis Housekeeper was informed that the remainder of the evaluation will be completed by another provider, this initial triage assessment does not replace that evaluation, and the importance of remaining in the ED until their evaluation is complete.     Rodney Booze, PA-C 01/29/21 1432    Lajean Saver, MD 01/30/21 1254

## 2021-02-13 DIAGNOSIS — R1084 Generalized abdominal pain: Secondary | ICD-10-CM | POA: Diagnosis not present

## 2021-02-13 DIAGNOSIS — R531 Weakness: Secondary | ICD-10-CM | POA: Diagnosis not present

## 2021-02-13 DIAGNOSIS — R457 State of emotional shock and stress, unspecified: Secondary | ICD-10-CM | POA: Diagnosis not present

## 2021-02-13 DIAGNOSIS — R109 Unspecified abdominal pain: Secondary | ICD-10-CM | POA: Diagnosis not present

## 2021-02-13 DIAGNOSIS — R52 Pain, unspecified: Secondary | ICD-10-CM | POA: Diagnosis not present

## 2021-02-13 DIAGNOSIS — Z5321 Procedure and treatment not carried out due to patient leaving prior to being seen by health care provider: Secondary | ICD-10-CM | POA: Diagnosis not present

## 2021-02-13 DIAGNOSIS — R5381 Other malaise: Secondary | ICD-10-CM | POA: Diagnosis not present

## 2021-02-14 DIAGNOSIS — K859 Acute pancreatitis without necrosis or infection, unspecified: Secondary | ICD-10-CM | POA: Insufficient documentation

## 2021-02-14 DIAGNOSIS — R109 Unspecified abdominal pain: Secondary | ICD-10-CM | POA: Diagnosis not present

## 2021-02-15 DIAGNOSIS — U071 COVID-19: Secondary | ICD-10-CM | POA: Diagnosis not present

## 2021-02-15 DIAGNOSIS — I509 Heart failure, unspecified: Secondary | ICD-10-CM | POA: Diagnosis not present

## 2021-02-15 DIAGNOSIS — K859 Acute pancreatitis without necrosis or infection, unspecified: Secondary | ICD-10-CM | POA: Diagnosis not present

## 2021-02-15 DIAGNOSIS — R590 Localized enlarged lymph nodes: Secondary | ICD-10-CM | POA: Diagnosis not present

## 2021-02-15 DIAGNOSIS — D61818 Other pancytopenia: Secondary | ICD-10-CM | POA: Diagnosis not present

## 2021-02-15 DIAGNOSIS — K5900 Constipation, unspecified: Secondary | ICD-10-CM | POA: Diagnosis not present

## 2021-02-16 DIAGNOSIS — D61818 Other pancytopenia: Secondary | ICD-10-CM | POA: Diagnosis not present

## 2021-02-16 DIAGNOSIS — K5900 Constipation, unspecified: Secondary | ICD-10-CM | POA: Diagnosis not present

## 2021-02-16 DIAGNOSIS — R590 Localized enlarged lymph nodes: Secondary | ICD-10-CM | POA: Diagnosis not present

## 2021-02-16 DIAGNOSIS — K859 Acute pancreatitis without necrosis or infection, unspecified: Secondary | ICD-10-CM | POA: Diagnosis not present

## 2021-02-16 DIAGNOSIS — F89 Unspecified disorder of psychological development: Secondary | ICD-10-CM | POA: Insufficient documentation

## 2021-02-23 DIAGNOSIS — K8500 Idiopathic acute pancreatitis without necrosis or infection: Secondary | ICD-10-CM | POA: Diagnosis not present

## 2021-02-23 DIAGNOSIS — F4310 Post-traumatic stress disorder, unspecified: Secondary | ICD-10-CM | POA: Diagnosis not present

## 2021-03-05 DIAGNOSIS — Z833 Family history of diabetes mellitus: Secondary | ICD-10-CM | POA: Diagnosis not present

## 2021-03-05 DIAGNOSIS — R7303 Prediabetes: Secondary | ICD-10-CM | POA: Diagnosis not present

## 2021-03-05 DIAGNOSIS — K8500 Idiopathic acute pancreatitis without necrosis or infection: Secondary | ICD-10-CM | POA: Diagnosis not present

## 2021-03-09 DIAGNOSIS — I509 Heart failure, unspecified: Secondary | ICD-10-CM | POA: Diagnosis not present

## 2021-03-09 DIAGNOSIS — K859 Acute pancreatitis without necrosis or infection, unspecified: Secondary | ICD-10-CM | POA: Diagnosis not present

## 2021-03-09 DIAGNOSIS — K5900 Constipation, unspecified: Secondary | ICD-10-CM | POA: Diagnosis not present

## 2021-03-09 DIAGNOSIS — F17210 Nicotine dependence, cigarettes, uncomplicated: Secondary | ICD-10-CM | POA: Diagnosis not present

## 2021-03-17 DIAGNOSIS — R109 Unspecified abdominal pain: Secondary | ICD-10-CM | POA: Diagnosis not present

## 2021-03-17 DIAGNOSIS — I509 Heart failure, unspecified: Secondary | ICD-10-CM | POA: Diagnosis not present

## 2021-03-17 DIAGNOSIS — K5900 Constipation, unspecified: Secondary | ICD-10-CM | POA: Diagnosis not present

## 2021-03-17 DIAGNOSIS — E039 Hypothyroidism, unspecified: Secondary | ICD-10-CM | POA: Diagnosis not present

## 2021-03-17 DIAGNOSIS — F17210 Nicotine dependence, cigarettes, uncomplicated: Secondary | ICD-10-CM | POA: Diagnosis not present

## 2021-04-02 DIAGNOSIS — R109 Unspecified abdominal pain: Secondary | ICD-10-CM | POA: Diagnosis not present

## 2021-04-19 DIAGNOSIS — R109 Unspecified abdominal pain: Secondary | ICD-10-CM | POA: Diagnosis not present

## 2021-04-19 DIAGNOSIS — K5900 Constipation, unspecified: Secondary | ICD-10-CM | POA: Diagnosis not present

## 2021-04-19 DIAGNOSIS — D649 Anemia, unspecified: Secondary | ICD-10-CM | POA: Diagnosis not present

## 2021-04-22 ENCOUNTER — Encounter: Payer: Self-pay | Admitting: Gastroenterology

## 2021-05-26 ENCOUNTER — Other Ambulatory Visit (INDEPENDENT_AMBULATORY_CARE_PROVIDER_SITE_OTHER): Payer: Medicare Other

## 2021-05-26 ENCOUNTER — Telehealth: Payer: Self-pay

## 2021-05-26 ENCOUNTER — Ambulatory Visit (INDEPENDENT_AMBULATORY_CARE_PROVIDER_SITE_OTHER): Payer: Medicare Other | Admitting: Gastroenterology

## 2021-05-26 ENCOUNTER — Other Ambulatory Visit: Payer: Self-pay

## 2021-05-26 ENCOUNTER — Encounter: Payer: Self-pay | Admitting: Gastroenterology

## 2021-05-26 VITALS — BP 132/82 | HR 80 | Ht 63.0 in | Wt 127.0 lb

## 2021-05-26 DIAGNOSIS — K859 Acute pancreatitis without necrosis or infection, unspecified: Secondary | ICD-10-CM | POA: Diagnosis not present

## 2021-05-26 DIAGNOSIS — K5909 Other constipation: Secondary | ICD-10-CM

## 2021-05-26 LAB — COMPREHENSIVE METABOLIC PANEL
ALT: 18 U/L (ref 0–53)
AST: 22 U/L (ref 0–37)
Albumin: 4.7 g/dL (ref 3.5–5.2)
Alkaline Phosphatase: 75 U/L (ref 39–117)
BUN: 17 mg/dL (ref 6–23)
CO2: 32 mEq/L (ref 19–32)
Calcium: 9.9 mg/dL (ref 8.4–10.5)
Chloride: 99 mEq/L (ref 96–112)
Creatinine, Ser: 0.83 mg/dL (ref 0.40–1.50)
GFR: 110.52 mL/min (ref >60.00)
Glucose, Bld: 95 mg/dL (ref 70–99)
Potassium: 5 mEq/L (ref 3.5–5.1)
Sodium: 139 mEq/L (ref 135–145)
Total Bilirubin: 0.4 mg/dL (ref 0.2–1.2)
Total Protein: 7.2 g/dL (ref 6.0–8.3)

## 2021-05-26 LAB — CBC WITH DIFFERENTIAL/PLATELET
Basophils Absolute: 0 10*3/uL (ref 0.0–0.1)
Basophils Relative: 0.3 % (ref 0.0–3.0)
Eosinophils Absolute: 0.1 10*3/uL (ref 0.0–0.7)
Eosinophils Relative: 1.4 % (ref 0.0–5.0)
HCT: 41.8 % (ref 39.0–52.0)
Hemoglobin: 13.9 g/dL (ref 13.0–17.0)
Lymphocytes Relative: 34.9 % (ref 12.0–46.0)
Lymphs Abs: 2.1 10*3/uL (ref 0.7–4.0)
MCHC: 33.2 g/dL (ref 30.0–36.0)
MCV: 95.6 fl (ref 78.0–100.0)
Monocytes Absolute: 0.4 10*3/uL (ref 0.1–1.0)
Monocytes Relative: 7 % (ref 3.0–12.0)
Neutro Abs: 3.3 10*3/uL (ref 1.4–7.7)
Neutrophils Relative %: 56.4 % (ref 43.0–77.0)
Platelets: 247 10*3/uL (ref 150.0–400.0)
RBC: 4.38 Mil/uL (ref 4.22–5.81)
RDW: 13.5 % (ref 11.5–15.5)
WBC: 5.9 10*3/uL (ref 4.0–10.5)

## 2021-05-26 LAB — LIPID PANEL
Cholesterol: 227 mg/dL — ABNORMAL HIGH (ref 0–200)
HDL: 68.5 mg/dL (ref >39.00)
LDL Cholesterol: 120 mg/dL — ABNORMAL HIGH (ref 0–99)
NonHDL: 158.06
Total CHOL/HDL Ratio: 3
Triglycerides: 192 mg/dL — ABNORMAL HIGH (ref 0.0–149.0)
VLDL: 38.4 mg/dL (ref 0.0–40.0)

## 2021-05-26 LAB — LIPASE: Lipase: 59 U/L (ref 11.0–59.0)

## 2021-05-26 LAB — AMYLASE: Amylase: 61 U/L (ref 27–131)

## 2021-05-26 MED ORDER — PANTOPRAZOLE SODIUM 40 MG PO TBEC: 40.0000 mg | DELAYED_RELEASE_TABLET | Freq: Every day | ORAL | 6 refills | Status: DC

## 2021-05-26 MED ORDER — TRAMADOL-ACETAMINOPHEN 37.5-325 MG PO TABS: 1.0000 | ORAL_TABLET | Freq: Two times a day (BID) | ORAL | 1 refills | Status: DC | PRN
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
## 2021-05-26 NOTE — Patient Instructions (Addendum)
If you are age 39 or older, your body mass index should be between 23-30. Your Body mass index is 22.5 kg/m. If this is out of the aforementioned range listed, please consider follow up with your Primary Care Provider.  If you are age 1 or younger, your body mass index should be between 19-25. Your Body mass index is 22.5 kg/m. If this is out of the aformentioned range listed, please consider follow up with your Primary Care Provider.   ________________________________________________________  The [facility] providers would like to encourage you to use [facility] to communicate with providers for non-urgent requests or questions.  Due to long hold times on the telephone, sending your provider a message by [facility] may be a faster and more efficient way to get a response.  Please allow 48 business hours for a response.  Please remember that this is for non-urgent requests.  _______________________________________________________  Stop smoking.  Please go to the lab on the 2nd floor [address] before you leave the office today.    We have sent the following medications to your pharmacy for you to pick up at your convenience:  Ultracet Protonix  Please purchase the following medications over the counter and take as directed: Miralax 17 g daily in [place] have been scheduled for an abdominal ultrasound at [place] on 05-31-2021  at 8am. Please arrive 15 minutes prior to your appointment for registration. Make certain not to have anything to eat or drink 6 hours prior to your appointment. Should you need to reschedule your appointment, please contact radiology at [phone]. This test typically takes about 30 minutes to perform.   Please call in 3 months to schedule an office visit.   Thank you,  Dr. Jackquline Denmark

## 2021-05-26 NOTE — Telephone Encounter (Signed)
Patient wanted ultrasound for Tony Elliott and not here so order was faxed successfully and was told to call Friday to schedule if [facility] didn't reach out due to holiday. They voiced understanding

## 2021-05-27 LAB — IGG 4: IgG, Subclass 4: 76 mg/dL (ref 2–96)

## 2021-05-30 LAB — DRUG MONITORING PANEL 375977 , URINE
Alcohol Metabolites: NEGATIVE ng/mL (ref <500)
Amphetamine: 1173 ng/mL — ABNORMAL HIGH (ref <250)
Amphetamines: POSITIVE ng/mL — AB (ref <500)
Barbiturates: NEGATIVE ng/mL (ref <300)
Benzodiazepines: NEGATIVE ng/mL (ref <100)
Cocaine Metabolite: NEGATIVE ng/mL (ref <150)
Desmethyltramadol: NEGATIVE ng/mL (ref <100)
Marijuana Metabolite: 65 ng/mL — ABNORMAL HIGH (ref <5)
Marijuana Metabolite: POSITIVE ng/mL — AB (ref <20)
Methamphetamine: 2560 ng/mL — ABNORMAL HIGH (ref <250)
Opiates: NEGATIVE ng/mL (ref <100)
Oxycodone: NEGATIVE ng/mL (ref <100)
Tramadol: NEGATIVE ng/mL (ref <100)

## 2021-05-30 LAB — DM TEMPLATE

## 2021-05-31 ENCOUNTER — Telehealth: Payer: Self-pay | Admitting: Gastroenterology

## 2021-05-31 ENCOUNTER — Other Ambulatory Visit: Payer: Self-pay

## 2021-05-31 ENCOUNTER — Ambulatory Visit (HOSPITAL_BASED_OUTPATIENT_CLINIC_OR_DEPARTMENT_OTHER): Payer: Medicare Other

## 2021-05-31 DIAGNOSIS — K859 Acute pancreatitis without necrosis or infection, unspecified: Secondary | ICD-10-CM

## 2021-05-31 DIAGNOSIS — K5909 Other constipation: Secondary | ICD-10-CM

## 2021-05-31 NOTE — Telephone Encounter (Signed)
Pt made aware of results and Dr. Lyndel Safe recommendations. Pt verbalized understanding with all questions answered.  Follow Up Visit scheduled for 06/11/2021 @ 11:20 in [facility] with Dr. Lyndel Safe Pt States that the pain medication he is using is not working and is not strong enough. Pt requesting something stronger. Please advise

## 2021-05-31 NOTE — Telephone Encounter (Signed)
Patient called seeking lab results also said the pain medication given is not working.

## 2021-06-11 ENCOUNTER — Ambulatory Visit: Payer: Medicare Other | Admitting: Gastroenterology

## 2021-06-11 DIAGNOSIS — R1013 Epigastric pain: Secondary | ICD-10-CM | POA: Diagnosis not present

## 2021-06-15 DIAGNOSIS — Z5321 Procedure and treatment not carried out due to patient leaving prior to being seen by health care provider: Secondary | ICD-10-CM | POA: Diagnosis not present

## 2021-06-15 DIAGNOSIS — R109 Unspecified abdominal pain: Secondary | ICD-10-CM | POA: Diagnosis not present

## 2021-06-18 ENCOUNTER — Telehealth: Payer: Self-pay | Admitting: Gastroenterology
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
## 2021-06-18 NOTE — Telephone Encounter (Signed)
Patient called again this afternoon.  He really wants someone to call him today about the issues stated in the previous note.  Concerned that he's been hospitalized twice in the last 6 months and has lost over 30 pounds.  Please call today.

## 2021-06-18 NOTE — Telephone Encounter (Signed)
Pt states that he is requesting pain medication that prescribed to him while he was in the Hospital and also stating that he wants to have a CT scan repeated. Pt was informed that I could try to get in him for a sooner appointment and was placed a brief hold while searching for the soonest appointment. The call was dropped by the pt. Pt was called back to confirm if the sooner appointment would accommodate pt. Pt then started on a rant stating " you are not Dr. Lyndel Safe Nurse, I want to speak to Dr. Lyndel Safe Nurse, You are not My God Damn Friend." Pt then hung up.

## 2021-06-18 NOTE — Telephone Encounter (Signed)
Inbound call from patient, stated that he wanted to speak with Dr. Steve Rattler nurse. I told him to hold for just a moment to see if I could get hold of the nurse. Patient then stated he refused to speak with the " guy" he was just on the phone with because he was no help and he hung up on him. Stated that he wanted to speak with Dr. Lyndel Safe. Patient was calling to say that he was an appointment for 12/29 and he needed something sooner. But next Thursday he is going out of town and will not return until after 12/29. Wanted to see if there was anything before and stated that he was in pain and needs more pain medications. Please advise.

## 2021-06-22 DIAGNOSIS — R1084 Generalized abdominal pain: Secondary | ICD-10-CM | POA: Diagnosis not present

## 2021-06-22 DIAGNOSIS — Z765 Malingerer [conscious simulation]: Secondary | ICD-10-CM | POA: Diagnosis not present

## 2021-06-22 DIAGNOSIS — Z79899 Other long term (current) drug therapy: Secondary | ICD-10-CM | POA: Diagnosis not present

## 2021-06-22 DIAGNOSIS — R109 Unspecified abdominal pain: Secondary | ICD-10-CM | POA: Diagnosis not present

## 2021-06-22 DIAGNOSIS — R455 Hostility: Secondary | ICD-10-CM | POA: Diagnosis not present

## 2021-06-23 NOTE — Telephone Encounter (Signed)
Pt mom Juliann Pulse  stated that pt was not available to speak with Nurse. Pt Mom confirmed office visit 07/01/2021 at 3:40 . Pt mom Juliann Pulse notified that if pt needs [place] to call us back. Juliann Pulse verbalized understanding with all questions answered.

## 2021-06-28 DIAGNOSIS — K859 Acute pancreatitis without necrosis or infection, unspecified: Secondary | ICD-10-CM | POA: Diagnosis not present

## 2021-06-29 DIAGNOSIS — K862 Cyst of pancreas: Secondary | ICD-10-CM | POA: Diagnosis not present

## 2021-06-29 DIAGNOSIS — K863 Pseudocyst of pancreas: Secondary | ICD-10-CM | POA: Diagnosis not present

## 2021-06-29 DIAGNOSIS — R609 Edema, unspecified: Secondary | ICD-10-CM | POA: Diagnosis not present

## 2021-06-30 ENCOUNTER — Other Ambulatory Visit: Payer: Self-pay

## 2021-07-01 ENCOUNTER — Encounter: Payer: Self-pay | Admitting: Gastroenterology

## 2021-07-01 ENCOUNTER — Ambulatory Visit (INDEPENDENT_AMBULATORY_CARE_PROVIDER_SITE_OTHER): Payer: Medicare Other | Admitting: Gastroenterology

## 2021-07-01 ENCOUNTER — Other Ambulatory Visit: Payer: Self-pay

## 2021-07-01 VITALS — BP 142/82 | HR 94 | Ht 63.0 in | Wt 131.0 lb

## 2021-07-01 DIAGNOSIS — K859 Acute pancreatitis without necrosis or infection, unspecified: Secondary | ICD-10-CM

## 2021-07-01 DIAGNOSIS — K5909 Other constipation: Secondary | ICD-10-CM

## 2021-07-01 DIAGNOSIS — Z8619 Personal history of other infectious and parasitic diseases: Secondary | ICD-10-CM

## 2021-07-01 DIAGNOSIS — R935 Abnormal findings on diagnostic imaging of other abdominal regions, including retroperitoneum: Secondary | ICD-10-CM

## 2021-07-01 DIAGNOSIS — R978 Other abnormal tumor markers: Secondary | ICD-10-CM | POA: Diagnosis not present

## 2021-07-01 DIAGNOSIS — K869 Disease of pancreas, unspecified: Secondary | ICD-10-CM | POA: Diagnosis not present

## 2021-07-01 MED ORDER — TRAMADOL-ACETAMINOPHEN 37.5-325 MG PO TABS: 1.0000 | ORAL_TABLET | Freq: Two times a day (BID) | ORAL | 1 refills | Status: DC | PRN

## 2021-07-01 NOTE — Patient Instructions (Addendum)
If you are age 39 or older, your body mass index should be between 23-30. Your Body mass index is 23.21 kg/m. If this is out of the aforementioned range listed, please consider follow up with your Primary Care Provider.  If you are age 3 or younger, your body mass index should be between 19-25. Your Body mass index is 23.21 kg/m. If this is out of the aformentioned range listed, please consider follow up with your Primary Care Provider.   ________________________________________________________  The [place] [facility] providers would like to encourage you to use [facility] to communicate with providers for non-urgent requests or questions.  Due to long hold times on the telephone, sending your provider a message by [facility] may be a faster and more efficient way to get a response.  Please allow 48 business hours for a response.  Please remember that this is for non-urgent requests.  _______________________________________________________  If you have any worsen pain please go to the ED.  Continue Protonix and Miralax daily  Please call in 12 weeks to make a follow up appointment.  A referral has been sent to the Pain clinc. If you haven't heard anything please call them.  Stop smoking  Please go to the lab on the 2nd floor [address] before you leave the office today.  Monday through Friday 8am to 4pm Close 12pm-1pm. We are closed on Monday.  Thank you,  Dr. Jackquline Denmark

## 2021-07-01 NOTE — Progress Notes (Signed)
Chief Complaint: GI issuses  Referring Provider:  No ref. provider found      ASSESSMENT AND PLAN;   #1.  Acute recurrent idiopathic pancreatitis. Abn MRCP with 1.9 cm lesion in pancreatic body with upstream PD dilatation and atrophy. D/d complex pseudocyst vs mass.  No ETOH since 2018.  No cocaine use.  Had normal LFTs.  Has asymptomatic cholelithiasis. Nl IgG4, Ca, TG.  #2. Chronic constipation  #3. H/O HCV 2014 (GT 1A, Viral load 53M) s/p Harvoni x 12 weeks 2017 with SVR. USE 2017 F2/F3 fibrosis. S/P vaccination for HAV 2017, immune to hepatitis B with. HBsAg -ve.  His CT AP 02/2021 did not show any cirrhosis or masses. Nl alb/plts  Plan: -EUS -Pain clinic ASAP -Check CBC, CMP, CA 19-9, CEA. lipase -Stop smoking -Ultracet 1 tab po q12hrs prn #30, 1 refill -Miralax 17g po qd -Protonix 40mg  po qd #30 -FU in 12 weeks -To ED, if pain gets worse.   I have sent a staff message to my colleagues who perform EUS to review the case. HPI:    Tony Elliott is a 40 y.o. male  With bipolar/PTSD/ADHD, HLD, ADHD, history of hepatitis C, H/O polysubstance abuse (now uses methamphetamine and marijuana), narcotic seeking behavior, tobacco abuse, H/O EtOH abuse (none since 2018)  With multiple admissions for acute pancreatitis.  FU from recent adm to [place] for acute pancreatitis, discharged 06/29/2021 with peak lipase 999.  Normal LFTs. Neg ETOH.  MRCP showed 1.9 cm cystic/solid lesion in the pancreatic body which could represent complex pancreatic pseudocyst versus necrotic pancreatic adenocarcinoma.  No biliary ductal dilatation.  He was treated with IV fluids, pain medications with plans to minimize narcotic use.  He continues to use meth amphetamine and marijuana several times a day.  He was sitting comfortably during history taking and exam.  But was complaining of abdominal pain and wanted me to give Norco.  I have reviewed multiple notes from recent admission in [place].  We have decided to send him to pain clinic for pain control.  I would only use Ultracet at this time.  Also reviewed, Adm 8/14 - 02/16/2021 with acute pancreatitis at [facility] with peak lipase 1005. CT AP with contrast did not show any pancreatitis.  He denies having any recent EtOH abuse or cocaine abuse.  His urine tox screen was positive for cannabis.  He was also found to be COVID-positive.  Managed conservatively.  Discharged on Zofran and oxycodone.  Reevaluated in ED 03/09/2021 and then again 03/17/2021 (labs: Normal LFTs, lipase 566 to 511), Nl WBC count, Hb 12.5-stable. Plt 198K, TG 136   No nausea or vomiting.  No jaundice dark urine or pale stools.  He has longstanding history of constipation and has been more constipated lately.   Continues to smoke despite medical advice.  He does have H/O gallstones  Also has occasional heartburn without odynophagia or dysphagia.  No fever chills or night sweats.  No recent weight loss.  No FH of pancreatitis, no history of trauma, no previous episodes of pancreatitis.   Addendum: I have reviewed notes from recent admission at [facility].  According to the note dated 06/29/2021 he would yell and curse at staff and would ask for IV Dilaudid.  Due to dangerous situation that happened on day shift involving the patient receiving IV pain medications, it was decided to hold off on narcotics.  Per notes, he terrorized the unit for hours".     Previous  GI procedures/history:  MRCP 06/29/2021 at [facility]) -- 1.9 cm cystic/solid lesion in the pancreatic body, indeterminate. Differential considerations include a complex pancreatic pseudocyst versus necrotic pancreatic adenocarcinoma given upstream ductal dilatation and associated distal pancreatic atrophy. Recommend endoscopic ultrasound/soft tissue sampling for further evaluation.   -- Mild peripancreatic stranding/edema, consistent with pancreatitis, likely secondary to  obstructing mass.  CTAP with contrast 02/2021 1. No acute findings within the abdomen or pelvis. No CT evidence of  acute pancreatitis. No bowel obstruction or evidence of bowel wall  inflammation. No evidence of acute solid organ abnormality. No renal  or ureteral calculi.  2. Fairly large amount of stool throughout the nondistended colon  (constipation?).   EGD 11/2010 -Normal exam. -Neg CLOtest  Right upper quadrant ultrasound 2017: 1.6 cm gallstone.  -H/O HCV 2014 (GT 1A, Viral load 81M) s/p Harvoni x 12 weeks 2017 with SVR. USE 2017 F2/F3 fibrosis. S/P vaccination for HAV 2017, immune to hepatitis B with. HBsAg -ve.  His CT AP 02/2021 did not show any cirrhosis or masses.  MRCP 06/29/2021 -- 1.9 cm cystic/solid lesion in the pancreatic body, indeterminate. Differential considerations include a complex pancreatic pseudocyst versus necrotic pancreatic adenocarcinoma given upstream ductal dilatation and associated distal pancreatic atrophy. Recommend endoscopic ultrasound/soft tissue sampling for further evaluation.   -- Mild peripancreatic stranding/edema, consistent with pancreatitis, likely secondary to obstructing mass. Past Medical History:  Diagnosis Date   Anxiety    Bipolar disorder (HCC)    Chronic hepatitis C (HCC)    Depression    GERD (gastroesophageal reflux disease)    H. pylori infection    History of respiratory failure    Hypercholesterolemia    Intentional drug overdose ([place])    with multiple suicidal gestures in the past   PTSD (post-traumatic stress disorder)    Thyroid disease     Past Surgical History:  Procedure Laterality Date   ESOPHAGOGASTRODUODENOSCOPY  11/19/2010   Normal EGD    Family History  Problem Relation Age of Onset   Heart disease Mother    High blood pressure Mother    Diabetes Mother    Colon cancer Neg Hx    Pancreatic cancer Neg Hx    Rectal cancer Neg Hx    Stomach cancer Neg Hx     Social History   Tobacco Use    Smoking status: Every Day   Smokeless tobacco: Never  Vaping Use   Vaping Use: Never used  Substance Use Topics   Alcohol use: No   Drug use: Yes    Types: Marijuana    Current Outpatient Medications  Medication Sig Dispense Refill   alprazolam (XANAX) 2 MG tablet Take 2 mg by mouth 3 (three) times daily as needed for anxiety.     hydrOXYzine (ATARAX) 25 MG tablet Take 1 tablet by mouth every 6 (six) hours as needed.     naloxone (NARCAN) nasal spray 4 mg/0.1 mL 1 spray into alternating nostrils once as needed (overdose) for up to 1 dose. One spray in either nostril once for known/suspected opioid overdose. May repeat every 2-3 minutes in alternating nostril til EMS arrives     oxyCODONE (OXY IR/ROXICODONE) 5 MG immediate release tablet Take 5 mg by mouth 4 (four) times daily as needed.     pantoprazole (PROTONIX) 40 MG tablet Take 1 tablet (40 mg total) by mouth daily. 30 tablet 6   traMADol-acetaminophen (ULTRACET) 37.5-325 MG tablet Take 1 tablet by mouth every 12 (twelve) hours as needed. [place]  tablet 1   traZODone (DESYREL) 50 MG tablet Take 1 tablet by mouth at bedtime as needed.     No current facility-administered medications for this visit.    Allergies  Allergen Reactions   Ketorolac Tromethamine Other (See Comments)    "makes me angry"   Ultram [Tramadol Hcl] Other (See Comments)    "makes me angry"    Review of Systems:  Psychiatric/Behavioral: Has anxiety or depression     Physical Exam:    BP (!) 142/82    Pulse 94    Ht 5\' 3"  (1.6 m)    Wt 131 lb (59.4 kg)    SpO2 99%    BMI 23.21 kg/m  Wt Readings from Last 3 Encounters:  07/01/21 131 lb (59.4 kg)  05/26/21 127 lb (57.6 kg)  01/03/13 130 lb (59 kg)   Constitutional:  Well-developed, in no acute distress. Psychiatric: Normal mood and affect. Behavior is normal. HEENT: Pupils normal.  Conjunctivae are normal. No scleral icterus. Neck supple.  Cardiovascular: Normal rate, regular rhythm. No  edema Pulmonary/chest: Effort normal and breath sounds normal. No wheezing, rales or rhonchi. Abdominal: Soft, nondistended.  Had voluntary guarding.  Bowel sounds active throughout. There are no masses palpable. No hepatomegaly. Rectal: Deferred Neurological: Alert and oriented to person place and time. Skin: Skin is warm and dry. No rashes noted.  Data Reviewed: I have personally reviewed following labs and imaging studies  CBC: CBC Latest Ref Rng & Units 05/26/2021 01/29/2021  WBC 4.0 - 10.5 K/uL 5.9 7.7  Hemoglobin 13.0 - 17.0 g/dL 13.9 15.2  Hematocrit 39.0 - 52.0 % 41.8 44.0  Platelets 150.0 - 400.0 K/uL 247.0 308    CMP: CMP Latest Ref Rng & Units 05/26/2021 01/29/2021  Glucose 70 - 99 mg/dL 95 126(H)  BUN 6 - 23 mg/dL 17 13  Creatinine 0.40 - 1.50 mg/dL 0.83 0.88  Sodium 135 - 145 mEq/L 139 135  Potassium 3.5 - 5.1 mEq/L 5.0 3.5  Chloride 96 - 112 mEq/L 99 101  CO2 19 - 32 mEq/L 32 24  Calcium 8.4 - 10.5 mg/dL 9.9 9.6  Total Protein 6.0 - 8.3 g/dL 7.2 7.3  Total Bilirubin 0.2 - 1.2 mg/dL 0.4 0.8  Alkaline Phos 39 - 117 U/L 75 81  AST 0 - 37 U/L 22 19  ALT 0 - 53 U/L 18 13       Carmell Austria, MD 07/01/2021, 4:14 PM  Cc: No ref. provider found

## 2021-07-06 ENCOUNTER — Other Ambulatory Visit: Payer: Self-pay

## 2021-07-06 ENCOUNTER — Telehealth: Payer: Self-pay

## 2021-07-06 DIAGNOSIS — R978 Other abnormal tumor markers: Secondary | ICD-10-CM

## 2021-07-06 DIAGNOSIS — R935 Abnormal findings on diagnostic imaging of other abdominal regions, including retroperitoneum: Secondary | ICD-10-CM

## 2021-07-06 DIAGNOSIS — K859 Acute pancreatitis without necrosis or infection, unspecified: Secondary | ICD-10-CM

## 2021-07-06 DIAGNOSIS — K869 Disease of pancreas, unspecified: Secondary | ICD-10-CM

## 2021-07-06 NOTE — Telephone Encounter (Signed)
EUS scheduled on 2/16 at 830 am with Tony Elliott at [facility]

## 2021-07-06 NOTE — Telephone Encounter (Signed)
-----   Message from Milus Banister, MD sent at 07/06/2021  7:43 AM EST ----- Regarding: RE: Non-urgent EUS Merrie Roof, Thanks.  Had acute pancreatic inflammation on MR last week. Should wait 3-4 weeks before EUS for that to settle down.  Pancreatitis might be gallstone related but should check on that area in his pancreas with EUS certainly.  Maryann Mccall, He needs upper EUS with myself or Gabe in about 4 weeks from now for abnormal pancreas.  Thanks  Wynetta Fines   ----- Message ----- From: Jackquline Denmark, MD Sent: 07/05/2021  10:17 AM EST To: Milus Banister, MD, # Subject: Non-urgent EUS                                  For abn MRCP.  41yr old with recurrent idiopathic pancreatitis. MRCP ([place]) with 1.9 cm lesion in body of pancreas with upstream PD dil and atrophy.  Radiology recommends EUS He does have asymptomatic cholelithiasis No alcohol  But unfortunately he has narcotic seeking behavior as well.  Being referred to pain clinic as well.  I have ordered labs including CA 19-9, CEA-he has not had those drawn yet.  Thanks as always for your help.   Have a happy new year.  RG

## 2021-07-06 NOTE — Telephone Encounter (Signed)
Pt's mother answered the phone and stated her son was not with her at the moment. Will attempt to reach pt at another time.

## 2021-07-08 NOTE — Telephone Encounter (Signed)
Spoke with pt's mother regarding upcoming procedure on 08/19/21 at 8:30 with DJ at [facility]. Instructions were given over the phone, as well as sent to pt's home address. No further questions at this time.

## 2021-08-05 ENCOUNTER — Telehealth: Payer: Self-pay | Admitting: Gastroenterology

## 2021-08-05 NOTE — Telephone Encounter (Signed)
Maddie this is a Dr Lyndel Safe pt.  I will forward to his nurse Remo Lipps.

## 2021-08-05 NOTE — Telephone Encounter (Signed)
Dr. Benay Spice calling to follow up on previous message.

## 2021-08-05 NOTE — Telephone Encounter (Signed)
Provider at [facility] called and stated that patient was in the ER having issues. She is wanting to discuss a plan of care for patient. Please advise.

## 2021-08-05 NOTE — Telephone Encounter (Signed)
Please see note below:  Provider at [facility] called and stated that patient was in the ER having issues. She is wanting to discuss a plan of care for patient.  Dr. Benay Spice [phone]

## 2021-08-06 NOTE — Telephone Encounter (Signed)
Patients mother called seeking advice if he can get his blood work done at [facility] today. Please advise.

## 2021-08-06 NOTE — Telephone Encounter (Signed)
Pt mom Juliann Pulse requesting if pt could go to [facility] today for the pt to get labs done. Juliann Pulse was notified that it is better if she stays within the [facility] so that we can see results quickly and our Dr's can advise off of the results with out delay. Juliann Pulse notified that she could go [facility] on the second floor [address] in the [place]: Juliann Pulse stated that she would take [facility] there and get the labs drawn. Juliann Pulse verbalized understanding with all questions answered.

## 2021-08-09 ENCOUNTER — Other Ambulatory Visit (INDEPENDENT_AMBULATORY_CARE_PROVIDER_SITE_OTHER): Payer: Medicare Other

## 2021-08-09 ENCOUNTER — Telehealth: Payer: Self-pay | Admitting: General Surgery

## 2021-08-09 DIAGNOSIS — Z8619 Personal history of other infectious and parasitic diseases: Secondary | ICD-10-CM | POA: Diagnosis not present

## 2021-08-09 DIAGNOSIS — K869 Disease of pancreas, unspecified: Secondary | ICD-10-CM

## 2021-08-09 DIAGNOSIS — R935 Abnormal findings on diagnostic imaging of other abdominal regions, including retroperitoneum: Secondary | ICD-10-CM

## 2021-08-09 DIAGNOSIS — R978 Other abnormal tumor markers: Secondary | ICD-10-CM

## 2021-08-09 DIAGNOSIS — K859 Acute pancreatitis without necrosis or infection, unspecified: Secondary | ICD-10-CM

## 2021-08-09 DIAGNOSIS — K5909 Other constipation: Secondary | ICD-10-CM | POA: Diagnosis not present

## 2021-08-09 LAB — COMPREHENSIVE METABOLIC PANEL
ALT: 10 U/L (ref 0–53)
AST: 15 U/L (ref 0–37)
Albumin: 4.5 g/dL (ref 3.5–5.2)
Alkaline Phosphatase: 78 U/L (ref 39–117)
BUN: 18 mg/dL (ref 6–23)
CO2: 30 mEq/L (ref 19–32)
Calcium: 9.6 mg/dL (ref 8.4–10.5)
Chloride: 100 mEq/L (ref 96–112)
Creatinine, Ser: 0.76 mg/dL (ref 0.40–1.50)
GFR: 113.34 mL/min (ref >60.00)
Glucose, Bld: 100 mg/dL — ABNORMAL HIGH (ref 70–99)
Potassium: 4.3 mEq/L (ref 3.5–5.1)
Sodium: 136 mEq/L (ref 135–145)
Total Bilirubin: 0.4 mg/dL (ref 0.2–1.2)
Total Protein: 6.9 g/dL (ref 6.0–8.3)

## 2021-08-09 LAB — CBC WITH DIFFERENTIAL/PLATELET
Basophils Absolute: 0 10*3/uL (ref 0.0–0.1)
Basophils Relative: 0.3 % (ref 0.0–3.0)
Eosinophils Absolute: 0.1 10*3/uL (ref 0.0–0.7)
Eosinophils Relative: 1.6 % (ref 0.0–5.0)
HCT: 40.7 % (ref 39.0–52.0)
Hemoglobin: 13.3 g/dL (ref 13.0–17.0)
Lymphocytes Relative: 28.2 % (ref 12.0–46.0)
Lymphs Abs: 2.2 10*3/uL (ref 0.7–4.0)
MCHC: 32.8 g/dL (ref 30.0–36.0)
MCV: 94.8 fl (ref 78.0–100.0)
Monocytes Absolute: 0.4 10*3/uL (ref 0.1–1.0)
Monocytes Relative: 5.4 % (ref 3.0–12.0)
Neutro Abs: 5 10*3/uL (ref 1.4–7.7)
Neutrophils Relative %: 64.5 % (ref 43.0–77.0)
Platelets: 296 10*3/uL (ref 150.0–400.0)
RBC: 4.29 Mil/uL (ref 4.22–5.81)
RDW: 13.3 % (ref 11.5–15.5)
WBC: 7.8 10*3/uL (ref 4.0–10.5)

## 2021-08-09 LAB — LIPASE: Lipase: 43 U/L (ref 11.0–59.0)

## 2021-08-09 NOTE — Telephone Encounter (Signed)
The patient and mother enter the clinic irrate due to the patients level of pain, they have not been able to accurately explain what they are needing. The patient is grimacing and holding his right side. Pulled his labs from his visit to [facility] and his Lipase was 1049. The patient left the hospital on 08/05/2021 AMA. Spoke with Dr Lyndel Safe and he recommended that the patient go to the ED here at [facility]. The patient refused to go right now and stated he had to go to [facility] for an appointment. I expressed to him this is Dr Steve Rattler recommendation. Pt verbalized understanding

## 2021-08-10 LAB — CANCER ANTIGEN 19-9: CA 19-9: 1981 U/mL — ABNORMAL HIGH (ref <34)

## 2021-08-10 LAB — CEA: CEA: 11.8 ng/mL — ABNORMAL HIGH

## 2021-08-11 ENCOUNTER — Encounter (HOSPITAL_COMMUNITY): Payer: Self-pay | Admitting: Gastroenterology

## 2021-08-13 ENCOUNTER — Telehealth: Payer: Self-pay | Admitting: Gastroenterology

## 2021-08-13 NOTE — Telephone Encounter (Signed)
I looked back at the history on this referral.  This pt had Medicare and Medicaid in January (when I authorized it)  that did not need an [place].  [employer] Medicare insurance was added by a user in the system on 08/09/21. I will work on it.

## 2021-08-13 NOTE — Telephone Encounter (Signed)
Inbound call from [place] calling in regards to a status of patients Pre authorization for procedure on 2/16 at [facility]. Please advise.

## 2021-08-13 NOTE — Telephone Encounter (Signed)
Amy please see message regarding upcoming procedure

## 2021-08-13 NOTE — Telephone Encounter (Signed)
Thank you :)

## 2021-08-16 ENCOUNTER — Telehealth: Payer: Self-pay | Admitting: Gastroenterology

## 2021-08-16 NOTE — Telephone Encounter (Signed)
Inbound call from patient would like a call back about pain med as well as discuss lab results more in depth

## 2021-08-17 NOTE — Telephone Encounter (Signed)
Patient is following up on prev message. He states he is worried and believe we do not care about his health with the cancer. States he is in a lot of pain and would like to go over results.

## 2021-08-18 ENCOUNTER — Other Ambulatory Visit: Payer: Self-pay

## 2021-08-18 ENCOUNTER — Emergency Department (HOSPITAL_COMMUNITY)
Admission: EM | Admit: 2021-08-18 | Discharge: 2021-08-18 | Disposition: A | Discharge status: Home / Self Care | Payer: Medicare Other | Attending: Emergency Medicine | Admitting: Emergency Medicine

## 2021-08-18 ENCOUNTER — Emergency Department (HOSPITAL_COMMUNITY): Payer: Medicare Other

## 2021-08-18 ENCOUNTER — Encounter (HOSPITAL_COMMUNITY): Payer: Self-pay

## 2021-08-18 DIAGNOSIS — R1013 Epigastric pain: Secondary | ICD-10-CM

## 2021-08-18 DIAGNOSIS — G8929 Other chronic pain: Secondary | ICD-10-CM | POA: Diagnosis not present

## 2021-08-18 DIAGNOSIS — C259 Malignant neoplasm of pancreas, unspecified: Secondary | ICD-10-CM | POA: Insufficient documentation

## 2021-08-18 DIAGNOSIS — K8689 Other specified diseases of pancreas: Secondary | ICD-10-CM

## 2021-08-18 LAB — LIPASE, BLOOD: Lipase: 55 U/L — ABNORMAL HIGH (ref 11–51)

## 2021-08-18 LAB — COMPREHENSIVE METABOLIC PANEL
ALT: 14 U/L (ref 0–44)
AST: 20 U/L (ref 15–41)
Albumin: 4.5 g/dL (ref 3.5–5.0)
Alkaline Phosphatase: 70 U/L (ref 38–126)
Anion gap: 8 (ref 5–15)
BUN: 16 mg/dL (ref 6–20)
CO2: 29 mmol/L (ref 22–32)
Calcium: 9.5 mg/dL (ref 8.9–10.3)
Chloride: 101 mmol/L (ref 98–111)
Creatinine, Ser: 0.83 mg/dL (ref 0.61–1.24)
GFR, Estimated: >60 mL/min (ref >60)
Glucose, Bld: 119 mg/dL — ABNORMAL HIGH (ref 70–99)
Potassium: 4.7 mmol/L (ref 3.5–5.1)
Sodium: 138 mmol/L (ref 135–145)
Total Bilirubin: 0.5 mg/dL (ref 0.3–1.2)
Total Protein: 7.5 g/dL (ref 6.5–8.1)

## 2021-08-18 LAB — URINALYSIS, ROUTINE W REFLEX MICROSCOPIC
Bilirubin Urine: NEGATIVE
Glucose, UA: NEGATIVE mg/dL
Ketones, ur: NEGATIVE mg/dL
Leukocytes,Ua: NEGATIVE
Nitrite: NEGATIVE
Protein, ur: NEGATIVE mg/dL
Specific Gravity, Urine: 1.029 (ref 1.005–1.030)
pH: 7 (ref 5.0–8.0)

## 2021-08-18 LAB — CBC WITH DIFFERENTIAL/PLATELET
Abs Immature Granulocytes: 0.01 10*3/uL (ref 0.00–0.07)
Basophils Absolute: 0 10*3/uL (ref 0.0–0.1)
Basophils Relative: 1 %
Eosinophils Absolute: 0.1 10*3/uL (ref 0.0–0.5)
Eosinophils Relative: 2 %
HCT: 41.7 % (ref 39.0–52.0)
Hemoglobin: 13.9 g/dL (ref 13.0–17.0)
Immature Granulocytes: 0 %
Lymphocytes Relative: 36 %
Lymphs Abs: 1.8 10*3/uL (ref 0.7–4.0)
MCH: 31 pg (ref 26.0–34.0)
MCHC: 33.3 g/dL (ref 30.0–36.0)
MCV: 93.1 fL (ref 80.0–100.0)
Monocytes Absolute: 0.3 10*3/uL (ref 0.1–1.0)
Monocytes Relative: 6 %
Neutro Abs: 2.9 10*3/uL (ref 1.7–7.7)
Neutrophils Relative %: 55 %
Platelets: 277 10*3/uL (ref 150–400)
RBC: 4.48 MIL/uL (ref 4.22–5.81)
RDW: 12.4 % (ref 11.5–15.5)
WBC: 5.2 10*3/uL (ref 4.0–10.5)
nRBC: 0 % (ref 0.0–0.2)

## 2021-08-18 MED ORDER — SODIUM CHLORIDE 0.9 % IV BOLUS
1000.0000 mL | Freq: Once | INTRAVENOUS | Status: AC
  Administered 2021-08-18: 1000 mL via INTRAVENOUS

## 2021-08-18 MED ORDER — OXYCODONE HCL 20 MG PO TABS: 20.0000 mg | ORAL_TABLET | Freq: Four times a day (QID) | ORAL | 0 refills | Status: DC | PRN

## 2021-08-18 MED ORDER — OXYCODONE-ACETAMINOPHEN 5-325 MG PO TABS
1.0000 | ORAL_TABLET | Freq: Once | ORAL | Status: AC
  Administered 2021-08-18: 1 via ORAL
  Filled 2021-08-18: qty 1

## 2021-08-18 MED ORDER — IOHEXOL 300 MG/ML  SOLN
100.0000 mL | Freq: Once | INTRAMUSCULAR | Status: AC | PRN
  Administered 2021-08-18: 100 mL via INTRAVENOUS

## 2021-08-18 MED ORDER — HYDROMORPHONE HCL 1 MG/ML IJ SOLN
1.0000 mg | Freq: Once | INTRAMUSCULAR | Status: AC
Start: 2021-08-18 — End: 2021-08-18
  Administered 2021-08-18: 1 mg via INTRAVENOUS
  Filled 2021-08-18: qty 1

## 2021-08-18 NOTE — Telephone Encounter (Signed)
Spoke with Pt Mom Juliann Pulse: Juliann Pulse stated that pt is not available to talk to: Juliann Pulse reminded of procedure tomorrow: Juliann Pulse verbalized understanding with all questions answered.

## 2021-08-18 NOTE — ED Provider Notes (Signed)
[facility] DEPT Provider Note   CSN: [id number] Arrival date & time: 08/18/21  1538     History  Chief Complaint  Patient presents with   Abdominal Pain    Tony Elliott is a 41 y.o. male.  Patient presents to ER with recurrence of his chronic abdominal pain.  Has had intermittent abdominal pain for the past 8 months or so described as epigastric and left upper quadrant.  Describes sharp and intermittent.  He was seen by GI several times and multiple ER visits in outside facilities.  He is scheduled for endoscopy tomorrow.  He presents to the ER as he ran out of his pain medication complaining of increased pain.  No reports of fevers or cough.  No reports of vomiting or diarrhea.  Last alcohol use was several months ago he states.      Home Medications Prior to Admission medications   Medication Sig Start Date End Date Taking? Authorizing Provider  oxyCODONE 20 MG TABS Take 1 tablet (20 mg total) by mouth every 6 (six) hours as needed for up to 5 doses for pain. 08/18/21  Yes Mccade Sullenberger, Greggory Brandy, MD  naloxone [facility]) nasal spray 4 mg/0.1 mL 1 spray into alternating nostrils once as needed (overdose) for up to 1 dose. One spray in either nostril once for known/suspected opioid overdose. May repeat every 2-3 minutes in alternating nostril til EMS arrives 06/29/21   [provider]  pantoprazole (PROTONIX) 40 MG tablet Take 1 tablet (40 mg total) by mouth daily. Patient not taking: Reported on 08/16/2021 05/26/21   Jackquline Denmark, MD      Allergies    Ketorolac tromethamine and Ultram [tramadol hcl]    Review of Systems   Review of Systems  Constitutional:  Negative for fever.  HENT:  Negative for ear pain and sore throat.   Eyes:  Negative for pain.  Respiratory:  Negative for cough.   Cardiovascular:  Negative for chest pain.  Gastrointestinal:  Positive for abdominal pain.  Genitourinary:  Negative for flank pain.  Musculoskeletal:   Negative for back pain.  Skin:  Negative for color change and rash.  Neurological:  Negative for syncope.  All other systems reviewed and are negative.  Physical Exam Updated Vital Signs BP (!) 159/115    Pulse (!) 119    Temp 98.9 F (37.2 C) (Oral)    Resp 20    SpO2 99%  Physical Exam Constitutional:      Appearance: He is well-developed.  HENT:     Head: Normocephalic.     Nose: Nose normal.  Eyes:     Extraocular Movements: Extraocular movements intact.  Cardiovascular:     Rate and Rhythm: Normal rate.  Pulmonary:     Effort: Pulmonary effort is normal.  Abdominal:     Tenderness: There is abdominal tenderness in the epigastric area and left upper quadrant.  Skin:    Coloration: Skin is not jaundiced.  Neurological:     Mental Status: He is alert. Mental status is at baseline.    ED Results / Procedures / Treatments   Labs (all labs ordered are listed, but only abnormal results are displayed) Labs Reviewed  COMPREHENSIVE METABOLIC PANEL - Abnormal; Notable for the following components:      Result Value   Glucose, Bld 119 (*)    All other components within normal limits  LIPASE, BLOOD - Abnormal; Notable for the following components:   Lipase 55 (*)  All other components within normal limits  CBC WITH DIFFERENTIAL/PLATELET  URINALYSIS, ROUTINE W REFLEX MICROSCOPIC    EKG None  Radiology CT Abdomen Pelvis W Contrast  Result Date: 08/18/2021 CLINICAL DATA:  Acute abdominal pain. EXAM: CT ABDOMEN AND PELVIS WITH CONTRAST TECHNIQUE: Multidetector CT imaging of the abdomen and pelvis was performed using the standard protocol following bolus administration of intravenous contrast. RADIATION DOSE REDUCTION: This exam was performed according to the departmental dose-optimization program which includes automated exposure control, adjustment of the mA and/or kV according to patient size and/or use of iterative reconstruction technique. CONTRAST:  136mL OMNIPAQUE  IOHEXOL 300 MG/ML  SOLN COMPARISON:  CT abdomen pelvis dated 08/06/2021. FINDINGS: Lower chest: Partially visualized 5 mm nodule in the lingula, present on the prior CT. The visualized lung bases are otherwise clear. No intra-abdominal free air or free fluid. Hepatobiliary: No focal liver abnormality is seen. No gallstones, gallbladder wall thickening, or biliary dilatation. Pancreas: A 2.7 x 2.9 cm hypoenhancing lesion in the body of the pancreas with atrophy of the distal gland and dilatation of the upstream main pancreatic duct most consistent with adenocarcinoma. Further characterization with MRI or endoscopy ultrasound, if not previously performed, is recommended. This lesion measures approximately 2.4 x 2.1 cm on the prior CT by my measurements. Spleen: Normal in size without focal abnormality. Adrenals/Urinary Tract: The adrenal glands unremarkable. The kidneys, visualized ureters, and urinary bladder appear unremarkable. Stomach/Bowel: There is moderate amount of stool throughout the colon. There is no bowel obstruction or active inflammation. The appendix is normal. Vascular/Lymphatic: The abdominal aorta and IVC are unremarkable. There is a left sided IVC anatomy. No portal venous gas. The SMV, splenic vein, and main portal vein are patent. There is however focal area of compression of the splenic vein adjacent to the mass. No definite adenopathy. A subcentimeter lymph node anterior to the pancreatic mass measures 7 mm short axis. Reproductive: The prostate and seminal vesicles are grossly unremarkable. No pelvic mass. Other: None Musculoskeletal: No acute or significant osseous findings. IMPRESSION: 1. Enlarging pancreatic mass most consistent with adenocarcinoma. 2. Focal area of compression of the splenic vein adjacent to the mass. The splenic vein remains patent. 3. No bowel obstruction. Normal appendix. 4. Partially visualized 5 mm nodule in the lingula, present on the prior CT. Electronically Signed    By: Anner Crete M.D.   On: 08/18/2021 19:19    Procedures Procedures    Medications Ordered in ED Medications  oxyCODONE-acetaminophen (PERCOCET/ROXICET) 5-325 MG per tablet 1 tablet (1 tablet Oral Given 08/18/21 1630)  HYDROmorphone (DILAUDID) injection 1 mg (1 mg Intravenous Given 08/18/21 1851)  sodium chloride 0.9 % bolus 1,000 mL (0 mLs Intravenous Stopped 08/18/21 1947)  iohexol (OMNIPAQUE) 300 MG/ML solution 100 mL (100 mLs Intravenous Contrast Given 08/18/21 1856)    ED Course/ Medical Decision Making/ A&P                           Medical Decision Making Amount and/or Complexity of Data Reviewed Radiology: ordered.  Risk Prescription drug management.   Review of records shows multiple telephone visits with his GI doctors regarding his abdominal pain.  Visit in July 01, 2021 for acute pancreatitis.  Labs today appear unremarkable white count normal hemoglobin normal chemistry normal lipase normal.  Patient continues have abdominal pain describes as severe.  Given Percocet with minimal improvement and given Dilaudid with improvement now.  CT abdomen pelvis showing enlarging pancreatic mass but  no other acute findings noted.  Patient has an appointment with his gastroenterologist tomorrow for procedure which I advised him to keep.  Refill for his medications provided to the pharmacy.  Advised return if he cannot keep down any fluids, has worsening pain or if he has any additional acute concerns to return back to the ER otherwise keep his appointment tomorrow.        Final Clinical Impression(s) / ED Diagnoses Final diagnoses:  Epigastric pain  Pancreatic mass    Rx / DC Orders ED Discharge Orders          Ordered    oxyCODONE 20 MG TABS  Every 6 hours PRN        08/18/21 1952              Luna Fuse, MD 08/18/21 1952

## 2021-08-18 NOTE — Discharge Instructions (Signed)
Keep your appointment with gastroenterology tomorrow.  Return if you have fevers worsening symptoms or any additional concerns.

## 2021-08-18 NOTE — ED Provider Triage Note (Signed)
Emergency Medicine Provider Triage Evaluation Note  Tony Elliott , a 41 y.o. male  was evaluated in triage.  Pt with known 2 cm mass in the pancreas that has doubled in size since December 2022 as well as history of recurrent pancreatitis secondary to alcohol abuse in the past now sober.  He presents today with worsening pain over the last week.  He is scheduled for endoscopy here tomorrow for further characterization of his pancreatic tumor.  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  Medical Decision Making  Medically screening exam initiated at 4:17 PM.  Appropriate orders placed.  Adonis Housekeeper was informed that the remainder of the evaluation will be completed by another provider, this initial triage assessment does not replace that evaluation, and the importance of remaining in the ED until their evaluation is complete.  This chart was dictated using voice recognition software, Dragon. Despite the best efforts of this provider to proofread and correct errors, errors may still occur which can change documentation meaning.    Emeline Darling, PA-C 08/18/21 1619

## 2021-08-18 NOTE — ED Triage Notes (Signed)
Pt reports severe abdominal pain that radiates to back. Hx of pancreatitis and mass on pancreas. Denies N/V/D.

## 2021-08-19 ENCOUNTER — Encounter (HOSPITAL_COMMUNITY)
Admission: RE | Disposition: A | Discharge status: Home / Self Care | Payer: Self-pay | Source: Home / Self Care | Attending: Gastroenterology

## 2021-08-19 ENCOUNTER — Ambulatory Visit (HOSPITAL_BASED_OUTPATIENT_CLINIC_OR_DEPARTMENT_OTHER): Payer: Medicare Other | Admitting: Registered Nurse

## 2021-08-19 ENCOUNTER — Ambulatory Visit (HOSPITAL_COMMUNITY)
Admission: RE | Admit: 2021-08-19 | Discharge: 2021-08-19 | Disposition: A | Discharge status: Home / Self Care | Payer: Medicare Other | Attending: Gastroenterology | Admitting: Gastroenterology

## 2021-08-19 ENCOUNTER — Encounter (HOSPITAL_COMMUNITY): Payer: Self-pay | Admitting: Gastroenterology

## 2021-08-19 ENCOUNTER — Telehealth: Payer: Self-pay

## 2021-08-19 ENCOUNTER — Ambulatory Visit (HOSPITAL_COMMUNITY): Payer: Medicare Other | Admitting: Registered Nurse

## 2021-08-19 DIAGNOSIS — F17210 Nicotine dependence, cigarettes, uncomplicated: Secondary | ICD-10-CM | POA: Diagnosis not present

## 2021-08-19 DIAGNOSIS — K8020 Calculus of gallbladder without cholecystitis without obstruction: Secondary | ICD-10-CM | POA: Insufficient documentation

## 2021-08-19 DIAGNOSIS — K859 Acute pancreatitis without necrosis or infection, unspecified: Secondary | ICD-10-CM

## 2021-08-19 DIAGNOSIS — C251 Malignant neoplasm of body of pancreas: Secondary | ICD-10-CM | POA: Insufficient documentation

## 2021-08-19 DIAGNOSIS — C259 Malignant neoplasm of pancreas, unspecified: Secondary | ICD-10-CM

## 2021-08-19 DIAGNOSIS — K8689 Other specified diseases of pancreas: Secondary | ICD-10-CM | POA: Diagnosis present

## 2021-08-19 DIAGNOSIS — F1290 Cannabis use, unspecified, uncomplicated: Secondary | ICD-10-CM | POA: Insufficient documentation

## 2021-08-19 DIAGNOSIS — R935 Abnormal findings on diagnostic imaging of other abdominal regions, including retroperitoneum: Secondary | ICD-10-CM

## 2021-08-19 DIAGNOSIS — K869 Disease of pancreas, unspecified: Secondary | ICD-10-CM

## 2021-08-19 DIAGNOSIS — R978 Other abnormal tumor markers: Secondary | ICD-10-CM

## 2021-08-19 DIAGNOSIS — R933 Abnormal findings on diagnostic imaging of other parts of digestive tract: Secondary | ICD-10-CM | POA: Diagnosis not present

## 2021-08-19 DIAGNOSIS — F418 Other specified anxiety disorders: Secondary | ICD-10-CM

## 2021-08-19 DIAGNOSIS — F319 Bipolar disorder, unspecified: Secondary | ICD-10-CM

## 2021-08-19 HISTORY — PX: ESOPHAGOGASTRODUODENOSCOPY (EGD) WITH PROPOFOL: SHX5813

## 2021-08-19 HISTORY — PX: EUS: SHX5427

## 2021-08-19 HISTORY — PX: FINE NEEDLE ASPIRATION: SHX5430

## 2021-08-19 SURGERY — UPPER ENDOSCOPIC ULTRASOUND (EUS) RADIAL
Anesthesia: Monitor Anesthesia Care

## 2021-08-19 MED ORDER — CIPROFLOXACIN IN D5W 400 MG/200ML IV SOLN
INTRAVENOUS | Status: AC
  Filled 2021-08-19: qty 200

## 2021-08-19 MED ORDER — OXYCODONE-ACETAMINOPHEN 10-325 MG PO TABS: 1.0000 | ORAL_TABLET | Freq: Four times a day (QID) | ORAL | 0 refills | Status: DC | PRN

## 2021-08-19 MED ORDER — LABETALOL HCL 5 MG/ML IV SOLN
INTRAVENOUS | Status: DC | PRN
  Administered 2021-08-19: 2.5 mg via INTRAVENOUS

## 2021-08-19 MED ORDER — FENTANYL CITRATE (PF) 100 MCG/2ML IJ SOLN
INTRAMUSCULAR | Status: AC
Start: 2021-08-19 — End: ?
  Filled 2021-08-19: qty 2

## 2021-08-19 MED ORDER — PROPOFOL 1000 MG/100ML IV EMUL
INTRAVENOUS | Status: AC
  Filled 2021-08-19: qty 100

## 2021-08-19 MED ORDER — LACTATED RINGERS IV SOLN
INTRAVENOUS | Status: AC | PRN
  Administered 2021-08-19: 1000 mL via INTRAVENOUS

## 2021-08-19 MED ORDER — MIDAZOLAM HCL 2 MG/2ML IJ SOLN
INTRAMUSCULAR | Status: DC | PRN
  Administered 2021-08-19 (×2): 2 mg via INTRAVENOUS

## 2021-08-19 MED ORDER — PHENYLEPHRINE HCL (PRESSORS) 10 MG/ML IV SOLN
INTRAVENOUS | Status: AC
  Filled 2021-08-19: qty 1

## 2021-08-19 MED ORDER — MIDAZOLAM HCL 2 MG/2ML IJ SOLN
INTRAMUSCULAR | Status: AC
  Filled 2021-08-19: qty 2

## 2021-08-19 MED ORDER — PROPOFOL 500 MG/50ML IV EMUL
INTRAVENOUS | Status: DC | PRN
  Administered 2021-08-19: 300 ug/kg/min via INTRAVENOUS

## 2021-08-19 MED ORDER — SODIUM CHLORIDE 0.9 % IV SOLN: INTRAVENOUS | Status: DC

## 2021-08-19 MED ORDER — ONDANSETRON HCL 4 MG/2ML IJ SOLN
INTRAMUSCULAR | Status: DC | PRN
  Administered 2021-08-19: 4 mg via INTRAVENOUS

## 2021-08-19 MED ORDER — FENTANYL CITRATE (PF) 100 MCG/2ML IJ SOLN
INTRAMUSCULAR | Status: DC | PRN
Start: 2021-08-19 — End: 2021-08-19
  Administered 2021-08-19: 100 ug via INTRAVENOUS

## 2021-08-19 MED ORDER — LACTATED RINGERS IV SOLN: INTRAVENOUS | Status: DC

## 2021-08-19 MED ORDER — LIDOCAINE HCL (CARDIAC) PF 100 MG/5ML IV SOSY
PREFILLED_SYRINGE | INTRAVENOUS | Status: DC | PRN
  Administered 2021-08-19: 100 mg via INTRAVENOUS

## 2021-08-19 MED ORDER — CIPROFLOXACIN IN D5W 400 MG/200ML IV SOLN
INTRAVENOUS | Status: DC | PRN
  Administered 2021-08-19: 400 mg via INTRAVENOUS

## 2021-08-19 SURGICAL SUPPLY — 15 items

## 2021-08-19 NOTE — Telephone Encounter (Signed)
Dr Ardis Hughs the pt is calling stating that the pain med that was prescribed today after the procedure is not working.  He says he took 2 pills about a 2 1/2 hours ago with no relief of the abd pain. He says this pain is the same that he has had in the past.  He says that he has been prescribed narcotics in the past and has to take more that is prescribed to get any relief.  He would like something stronger.  Please advise

## 2021-08-19 NOTE — Anesthesia Postprocedure Evaluation (Signed)
Anesthesia Post Note  Patient: Tony Elliott  Procedure(s) Performed: UPPER ENDOSCOPIC ULTRASOUND (EUS) RADIAL ESOPHAGOGASTRODUODENOSCOPY (EGD) WITH PROPOFOL FINE NEEDLE ASPIRATION (FNA) LINEAR     Patient location during evaluation: Endoscopy Anesthesia Type: MAC Level of consciousness: oriented, awake and alert and awake Pain management: pain level controlled Vital Signs Assessment: post-procedure vital signs reviewed and stable Respiratory status: spontaneous breathing, nonlabored ventilation, respiratory function stable and patient connected to nasal cannula oxygen Cardiovascular status: blood pressure returned to baseline and stable Postop Assessment: no headache, no backache and no apparent nausea or vomiting Anesthetic complications: no   No notable events documented.  Last Vitals:  Vitals:   08/19/21 0913 08/19/21 0923  BP: 132/77 (!) 130/95  Pulse: 84 81  Resp: 16 14  Temp:    SpO2: 98% 100%    Last Pain:  Vitals:   08/19/21 0923  TempSrc:   PainSc: [place]

## 2021-08-19 NOTE — Telephone Encounter (Signed)
-----   Message from Milus Banister, MD sent at 08/19/2021  9:02 AM EST ----- Tony Elliott, This looks like a cancer, bad actor with very elevated CA19-9 See full report in epic   - 3cm necrotic mass in the body of the pancreas causing main pancreatic duct dilation, directly abutting the splenic vessels but no other major vessels. There are several small scattered peripancreatic lymphnodes. - Preliminary cytology from transgastric FNA was positive for malignancy, adenocarcinoma. - Shadowing cholelithiasis.   Tony Elliott, He needs new appt at medical oncology for newly diagnosed pancreatic adeno of tail, CA 19-9 >1,000. Also needs referral to [facility] for the same  Thanks

## 2021-08-19 NOTE — Discharge Instructions (Signed)
YOU HAD AN ENDOSCOPIC PROCEDURE TODAY: Refer to the procedure report and other information in the discharge instructions given to you for any specific questions about what was found during the examination. If this information does not answer your questions, please call [place] office at [phone] to clarify.  ° °YOU SHOULD EXPECT: Some feelings of bloating in the abdomen. Passage of more gas than usual. Walking can help get rid of the air that was put into your GI tract during the procedure and reduce the bloating. If you had a lower endoscopy (such as a colonoscopy or flexible sigmoidoscopy) you may notice spotting of blood in your stool or on the toilet paper. Some abdominal soreness may be present for a day or two, also. ° °DIET: Your first meal following the procedure should be a light meal and then it is ok to progress to your normal diet. A half-sandwich or bowl of soup is an example of a good first meal. Heavy or fried foods are harder to digest and may make you feel nauseous or bloated. Drink plenty of fluids but you should avoid alcoholic beverages for 24 hours. If you had a esophageal dilation, please see attached instructions for diet.   ° °ACTIVITY: Your care partner should take you home directly after the procedure. You should plan to take it easy, moving slowly for the rest of the day. You can resume normal activity the day after the procedure however YOU SHOULD NOT DRIVE, use power tools, machinery or perform tasks that involve climbing or major physical exertion for 24 hours (because of the sedation medicines used during the test).  ° °SYMPTOMS TO REPORT IMMEDIATELY: °A gastroenterologist can be reached at any hour. Please call [phone]  for any of the following symptoms:  °Following lower endoscopy (colonoscopy, flexible sigmoidoscopy) °Excessive amounts of blood in the stool  °Significant tenderness, worsening of abdominal pains  °Swelling of the abdomen that is new, acute  °Fever of 100° or  higher  °Following upper endoscopy (EGD, EUS, ERCP, esophageal dilation) °Vomiting of blood or coffee ground material  °New, significant abdominal pain  °New, significant chest pain or pain under the shoulder blades  °Painful or persistently difficult swallowing  °New shortness of breath  °Black, tarry-looking or red, bloody stools ° °FOLLOW UP:  °If any biopsies were taken you will be contacted by phone or by letter within the next 1-3 weeks. Call [phone]  if you have not heard about the biopsies in 3 weeks.  °Please also call with any specific questions about appointments or follow up tests. ° °

## 2021-08-19 NOTE — Transfer of Care (Signed)
Immediate Anesthesia Transfer of Care Note  Patient: Tony Elliott  Procedure(s) Performed: UPPER ENDOSCOPIC ULTRASOUND (EUS) RADIAL ESOPHAGOGASTRODUODENOSCOPY (EGD) WITH PROPOFOL FINE NEEDLE ASPIRATION (FNA) LINEAR  Patient Location: PACU and Endoscopy Unit  Anesthesia Type:MAC  Level of Consciousness: sedated  Airway & Oxygen Therapy: Patient Spontanous Breathing and Patient connected to face mask oxygen  Post-op Assessment: Report given to RN and Post -op Vital signs reviewed and stable  Post vital signs: Reviewed and stable  Last Vitals:  Vitals Value Taken Time  BP    Temp    Pulse 86 08/19/21 0855  Resp 15 08/19/21 0855  SpO2 99 % 08/19/21 0855  Vitals shown include unvalidated device data.  Last Pain:  Vitals:   08/19/21 0853  TempSrc: Tympanic  PainSc: Asleep         Complications: No notable events documented.

## 2021-08-19 NOTE — Telephone Encounter (Signed)
The referrals have been made.  Records faxed to [facility]

## 2021-08-19 NOTE — Anesthesia Preprocedure Evaluation (Signed)
Anesthesia Evaluation  Patient identified by MRN, date of birth, ID band Patient awake    Reviewed: Allergy & Precautions, NPO status , Patient's Chart, lab work & pertinent test results  Airway Mallampati: II  TM Distance: >3 FB Neck ROM: Full    Dental  (+) Teeth Intact, Dental Advisory Given   Pulmonary Current Smoker,    Pulmonary exam normal breath sounds clear to auscultation       Cardiovascular negative cardio ROS Normal cardiovascular exam Rhythm:Regular Rate:Normal     Neuro/Psych PSYCHIATRIC DISORDERS Anxiety Depression Bipolar Disorder negative neurological ROS     GI/Hepatic GERD  Medicated,(+) Hepatitis -, C"abnormal pancreas"   Endo/Other  negative endocrine ROS  Renal/GU negative Renal ROS     Musculoskeletal negative musculoskeletal ROS (+)   Abdominal   Peds  Hematology negative hematology ROS (+)   Anesthesia Other Findings Day of surgery medications reviewed with the patient.  Reproductive/Obstetrics                             Anesthesia Physical Anesthesia Plan  ASA: 2  Anesthesia Plan: MAC   Post-op Pain Management:    Induction: Intravenous  PONV Risk Score and Plan: 0 and TIVA and Treatment may vary due to age or medical condition  Airway Management Planned: Natural Airway and Nasal Cannula  Additional Equipment:   Intra-op Plan:   Post-operative Plan:   Informed Consent: I have reviewed the patients History and Physical, chart, labs and discussed the procedure including the risks, benefits and alternatives for the proposed anesthesia with the patient or authorized representative who has indicated his/her understanding and acceptance.     Dental advisory given  Plan Discussed with: CRNA  Anesthesia Plan Comments:         Anesthesia Quick Evaluation

## 2021-08-19 NOTE — Op Note (Addendum)
[facility] Patient Name: Tony Elliott Procedure Date: 08/19/2021 MRN: [id number] Attending MD: Milus Banister , MD Date of Birth: 1981/03/07 CSN: [id number] Age: 41 Admit Type: Outpatient Procedure:                Upper EUS Indications:              Mass in pancreatic body, dilated upstream                            pancreatic duct, normal LFTs Providers:                Milus Banister, MD, Doristine Johns, RN, Luan Moore, Technician, Enrigue Catena, CRNA Referring MD:             Carmell Austria, MD Medicines:                Monitored Anesthesia Care; Cipro 400mg  IV given Complications:            No immediate complications. Estimated blood loss:                            None. Estimated Blood Loss:     Estimated blood loss: none. Procedure:                Pre-Anesthesia Assessment:                           - Prior to the procedure, a History and Physical                            was performed, and patient medications and                            allergies were reviewed. The patient's tolerance of                            previous anesthesia was also reviewed. The risks                            and benefits of the procedure and the sedation                            options and risks were discussed with the patient.                            All questions were answered, and informed consent                            was obtained. Prior Anticoagulants: The patient has                            taken no previous anticoagulant or antiplatelet  agents. ASA Grade Assessment: II - A patient with                            mild systemic disease. After reviewing the risks                            and benefits, the patient was deemed in                            satisfactory condition to undergo the procedure.                           After obtaining informed consent, the endoscope was                             passed under direct vision. Throughout the                            procedure, the patient's blood pressure, pulse, and                            oxygen saturations were monitored continuously. The                            GF-[id number]-[id number] ([id number]) Olympus radial ultrasound                            scope was introduced through the mouth, and                            advanced to the second part of duodenum. The                            [id number] ([id number]) Olympus linear ultrasound scope                            was introduced through the mouth, and advanced to                            the second part of duodenum. The upper EUS was                            accomplished without difficulty. The patient                            tolerated the procedure well. Scope In: Scope Out: Findings:      ENDOSCOPIC FINDING (limited views with radial and linear EUS scopes): :      The examined esophagus was endoscopically normal.      The entire examined stomach was endoscopically normal.      The examined duodenum was endoscopically normal.      ENDOSONOGRAPHIC FINDING: :      1. 3cm hypoechoic, heterogeneous mass with apparent central necrosis.       The mass has very irregular  outer margins and it clearly obstructs the       main pancreatic duct. The mass abuts the splenic vein and artery but no       other signficant blood vessels.      2. Several small (5-18mm) scattered peripancreatic lymphnodes.      3. Large shadowing gallstone in the gallbladder.      4. CBD was normal, non-dilated.      5. Limited views of the liver, spleen were all normal. Impression:               - 3cm necrotic mass in the body of the pancreas                            causing main pancreatic duct dilation, directly                            abutting the splenic vessels but no other major                            vessels. There are several small scattered                            peripancreatic  lymphnodes.                           - Preliminary cytology from transgastric FNA was                            positive for malignancy, adenocarcinoma.                           - Shadowing cholelithiasis. Moderate Sedation:      Not Applicable - Patient had care per Anesthesia. Recommendation:           - Hopefully discharge home with expedited oncologic                            follow up.                           - Pain control is a signficant issue for him. Procedure Code(s):        --- Professional ---                           [phone], Esophagogastroduodenoscopy, flexible,                            transoral; with transendoscopic ultrasound-guided                            intramural or transmural fine needle                            aspiration/biopsy(s), (includes endoscopic                            ultrasound examination limited to the esophagus,  stomach or duodenum, and adjacent structures) Diagnosis Code(s):        --- Professional ---                           K86.89, Other specified diseases of pancreas                           R93.3, Abnormal findings on diagnostic imaging of                            other parts of digestive tract CPT copyright 2019 American [employer] Association. All rights reserved. The codes documented in this report are preliminary and upon coder review may  be revised to meet current compliance requirements. Milus Banister, MD 08/19/2021 8:57:13 AM This report has been signed electronically. Number of Addenda: 0

## 2021-08-19 NOTE — Telephone Encounter (Signed)
Inbound call from patient mother, states she took 2 oxycodone pills around 13 and it is not working for him. And was told to give a call if it does not help with his pain

## 2021-08-19 NOTE — H&P (Signed)
HPI: This is a man referred by Dr. Lyndel Safe for abnormal pancreas.  He is in signficant abd pain in admitting.  I reviewed several imaging reports and images as well, including yesterday CT scan  ROS: complete GI ROS as described in HPI, all other review negative.  Constitutional:  No unintentional weight loss     Past Medical History:  Diagnosis Date   Anxiety    Bipolar disorder ([place])    Chronic hepatitis C (H[facility])    Depression    GERD (gastroesophageal reflux disease)    H. pylori infection    History of respiratory failure    Hypercholesterolemia    Intentional drug overdose ([place])    with multiple suicidal gestures in the past   PTSD (post-traumatic stress disorder)    Thyroid disease     Past Surgical History:  Procedure Laterality Date   ESOPHAGOGASTRODUODENOSCOPY  11/19/2010   Normal EGD    Current Facility-Administered Medications  Medication Dose Route Frequency Provider Last Rate Last Admin   0.9 %  sodium chloride infusion   Intravenous Continuous Milus Banister, MD       lactated ringers infusion   Intravenous Continuous Milus Banister, MD        Allergies as of 07/06/2021 - Review Complete 07/01/2021  Allergen Reaction Noted   Ketorolac tromethamine Other (See Comments) 09/21/2011   Ultram [tramadol hcl] Other (See Comments) 09/21/2011    Family History  Problem Relation Age of Onset   Heart disease Mother    High blood pressure Mother    Diabetes Mother    Colon cancer Neg Hx    Pancreatic cancer Neg Hx    Rectal cancer Neg Hx    Stomach cancer Neg Hx     Social History   Socioeconomic History   Marital status: Single    Spouse name: Not on file   Number of children: Not on file   Years of education: Not on file   Highest education level: Not on file  Occupational History   Not on file  Tobacco Use   Smoking status: Every Day   Smokeless tobacco: Never  Vaping Use   Vaping Use: Never used  Substance and Sexual Activity   Alcohol  use: No   Drug use: Yes    Types: Marijuana   Sexual activity: Not on file  Other Topics Concern   Not on file  Social History Narrative   Not on file   Social Determinants of Health   Financial Resource Strain: Not on file  Food Insecurity: Not on file  Transportation Needs: Not on file  Physical Activity: Not on file  Stress: Not on file  Social Connections: Not on file  Intimate Partner Violence: Not on file     Physical Exam: BP (!) 170/99    Pulse 86    Temp 98.6 F (37 C) (Tympanic)    Resp 14    Ht 5\' 3"  (1.6 m)    Wt 59.4 kg    SpO2 100%    BMI 23.20 kg/m  Constitutional: generally well-appearing Psychiatric: alert and oriented x3 Abdomen: soft, nontender, nondistended, no obvious ascites, no peritoneal signs, normal bowel sounds No peripheral edema noted in lower extremities  Assessment and plan: 41 y.o. male with mass in body of the pancreas  For EUS evaluation with FNA today  Please see the "Patient Instructions" section for addition details about the plan.  Owens Loffler, MD [facility] 08/19/2021, 7:57 AM

## 2021-08-19 NOTE — Anesthesia Procedure Notes (Signed)
Procedure Name: MAC Date/Time: 08/19/2021 8:00 AM Performed by: Lissa Morales, CRNA Pre-anesthesia Checklist: Patient identified, Emergency Drugs available, Suction available, Patient being monitored and Timeout performed Patient Re-evaluated:Patient Re-evaluated prior to induction Oxygen Delivery Method: Circle system utilized and Simple face mask Preoxygenation: Pre-oxygenation with 100% oxygen Placement Confirmation: positive ETCO2

## 2021-08-20 LAB — CYTOLOGY - NON PAP

## 2021-08-23 ENCOUNTER — Inpatient Hospital Stay: Payer: Medicare Other | Attending: Oncology | Admitting: Oncology

## 2021-08-23 ENCOUNTER — Other Ambulatory Visit: Payer: Self-pay | Admitting: Oncology

## 2021-08-23 ENCOUNTER — Inpatient Hospital Stay: Payer: Medicare Other

## 2021-08-23 ENCOUNTER — Other Ambulatory Visit: Payer: Self-pay

## 2021-08-23 VITALS — BP 138/98 | HR 148 | Temp 98.3°F | Resp 16 | Ht 63.0 in | Wt 127.0 lb

## 2021-08-23 DIAGNOSIS — F909 Attention-deficit hyperactivity disorder, unspecified type: Secondary | ICD-10-CM | POA: Insufficient documentation

## 2021-08-23 DIAGNOSIS — F4310 Post-traumatic stress disorder, unspecified: Secondary | ICD-10-CM | POA: Insufficient documentation

## 2021-08-23 DIAGNOSIS — Z550 Illiteracy and low-level literacy: Secondary | ICD-10-CM | POA: Insufficient documentation

## 2021-08-23 DIAGNOSIS — C251 Malignant neoplasm of body of pancreas: Secondary | ICD-10-CM

## 2021-08-23 DIAGNOSIS — K869 Disease of pancreas, unspecified: Secondary | ICD-10-CM | POA: Diagnosis not present

## 2021-08-23 DIAGNOSIS — R768 Other specified abnormal immunological findings in serum: Secondary | ICD-10-CM | POA: Insufficient documentation

## 2021-08-23 DIAGNOSIS — K5900 Constipation, unspecified: Secondary | ICD-10-CM | POA: Insufficient documentation

## 2021-08-23 DIAGNOSIS — R590 Localized enlarged lymph nodes: Secondary | ICD-10-CM | POA: Insufficient documentation

## 2021-08-23 MED ORDER — FENTANYL 50 MCG/HR TD PT72: 1.0000 | MEDICATED_PATCH | TRANSDERMAL | 0 refills | Status: DC

## 2021-08-23 NOTE — Progress Notes (Signed)
[facility]  [address] [place],  [place]  [address] [phone]  Clinic Day:  08/23/2021  Referring physician: Milus Banister, MD   HISTORY OF PRESENT ILLNESS:  The patient is a 41 y.o. male who I was asked to consult upon for newly diagnosed pancreatic cancer.  His history dates back approximately 8 months ago when he first began having left upper quadrant pain.  Over months, his pain began radiating to his back.  His mother believes he has lost as much as 15 pounds during the same time.  As the symptoms became virtually unbearable, he came to the emergency room for further evaluation in early February 2022.  CT scans done at that time showed a 2.2 cm mass in the body of his pancreas, which was leading to secondary pancreatic ductal dilatation.  Last week, the patient underwent endoscopic ultrasound with biopsy, whose findings revealed adenocarcinoma.  Of note, scans showed no obvious evidence of any metastatic disease.  He comes in today to go over his biopsy and scan results, as well as their implications.  The patient did come to the emergency room over the weekend with severe abdominal pain.  Unlike scans done a few weeks ago, his most recent CT scan showed a fluid collection at the tail of his pancreas that was abutting against his spleen.  The concern was these findings were reflective of another bout of pancreatitis.  PAST MEDICAL HISTORY:   Past Medical History:  Diagnosis Date   Anxiety    Bipolar disorder ([place])    Chronic hepatitis C ([place])    Depression    GERD (gastroesophageal reflux disease)    H. pylori infection    History of respiratory failure    Hypercholesterolemia    Intentional drug overdose ([place])    with multiple suicidal gestures in the past   PTSD (post-traumatic stress disorder)    Thyroid disease     PAST SURGICAL HISTORY:   Past Surgical History:  Procedure Laterality Date   ESOPHAGOGASTRODUODENOSCOPY  11/19/2010    Normal EGD   ESOPHAGOGASTRODUODENOSCOPY (EGD) WITH PROPOFOL N/A 08/19/2021   Procedure: ESOPHAGOGASTRODUODENOSCOPY (EGD) WITH PROPOFOL;  Surgeon: Milus Banister, MD;  Location: [facility];  Service: Endoscopy;  Laterality: N/A;   EUS N/A 08/19/2021   Procedure: UPPER ENDOSCOPIC ULTRASOUND (EUS) RADIAL;  Surgeon: Milus Banister, MD;  Location: [facility];  Service: Endoscopy;  Laterality: N/A;   FINE NEEDLE ASPIRATION N/A 08/19/2021   Procedure: FINE NEEDLE ASPIRATION (FNA) LINEAR;  Surgeon: Milus Banister, MD;  Location: [facility];  Service: Endoscopy;  Laterality: N/A;    CURRENT MEDICATIONS:   Current Outpatient Medications  Medication Sig Dispense Refill   fentaNYL (DURAGESIC) 50 MCG/HR Place 1 patch onto the skin every 3 (three) days. 10 patch 0   naloxone (NARCAN) nasal spray 4 mg/0.1 mL 1 spray into alternating nostrils once as needed (overdose) for up to 1 dose. One spray in either nostril once for known/suspected opioid overdose. May repeat every 2-3 minutes in alternating nostril til EMS arrives     oxyCODONE-acetaminophen (PERCOCET) 10-325 MG tablet Take 1-2 tablets by mouth every 6 (six) hours as needed for pain. 60 tablet 0   pantoprazole (PROTONIX) 40 MG tablet Take 1 tablet (40 mg total) by mouth daily. 30 tablet 6   No current facility-administered medications for this visit.    ALLERGIES:   Allergies  Allergen Reactions   Ketorolac Tromethamine Other (See Comments)    "makes  me angry"   Ultram [Tramadol Hcl] Other (See Comments)    "makes me angry"    FAMILY HISTORY:   Family History  Problem Relation Age of Onset   Heart disease Mother    High blood pressure Mother    Diabetes Mother    Colon cancer Neg Hx    Pancreatic cancer Neg Hx    Rectal cancer Neg Hx    Stomach cancer Neg Hx     SOCIAL HISTORY:  The patient was born in [place]; she currently lives in [place].  He is single, with no children.  He has never worked due to being  on disability for multiple psychiatric issues.  Of note, he admits to using crystal methamphetamine 4 times daily he also admits to marijuana use.  There is also a history of other illicit drug use.   REVIEW OF SYSTEMS:  Review of Systems  Constitutional:  Positive for fatigue and unexpected weight change. Negative for fever.  Respiratory:  Negative for chest tightness, cough, hemoptysis and shortness of breath.   Cardiovascular:  Negative for chest pain and palpitations.  Gastrointestinal:  Positive for abdominal pain. Negative for abdominal distention, blood in stool, constipation, diarrhea, nausea and vomiting.  Genitourinary:  Negative for dysuria, frequency and hematuria.   Musculoskeletal:  Negative for arthralgias, back pain and myalgias.  Skin:  Negative for itching and rash.  Neurological:  Positive for headaches. Negative for dizziness and light-headedness.  Psychiatric/Behavioral:  Positive for decreased concentration and depression. Negative for suicidal ideas. The patient is nervous/anxious.     PHYSICAL EXAM:  Blood pressure (!) 138/98, pulse (!) 148, temperature 98.3 F (36.8 C), resp. rate 16, height 5\' 3"  (1.6 m), weight 127 lb (57.6 kg), SpO2 94 %. Wt Readings from Last 3 Encounters:  08/23/21 127 lb (57.6 kg)  08/19/21 130 lb 15.3 oz (59.4 kg)  07/01/21 131 lb (59.4 kg)   Body mass index is 22.5 kg/m. Performance status (ECOG): 1 - Symptomatic but completely ambulatory Physical Exam Constitutional:      Appearance: Normal appearance. He is not ill-appearing.     Comments: He appears to be in significant pain; unkempt  HENT:     Mouth/Throat:     Mouth: Mucous membranes are moist.     Pharynx: Oropharynx is clear. No oropharyngeal exudate or posterior oropharyngeal erythema.  Cardiovascular:     Rate and Rhythm: Normal rate and regular rhythm.     Heart sounds: No murmur heard.   No friction rub. No gallop.  Pulmonary:     Effort: Pulmonary effort is normal. No  respiratory distress.     Breath sounds: Normal breath sounds. No wheezing, rhonchi or rales.  Abdominal:     General: Bowel sounds are normal. There is no distension.     Palpations: Abdomen is soft. There is no mass.     Tenderness: There is no abdominal tenderness.  Musculoskeletal:        General: No swelling.     Right lower leg: No edema.     Left lower leg: No edema.  Lymphadenopathy:     Cervical: No cervical adenopathy.     Upper Body:     Right upper body: No supraclavicular or axillary adenopathy.     Left upper body: No supraclavicular or axillary adenopathy.     Lower Body: No right inguinal adenopathy. No left inguinal adenopathy.  Skin:    General: Skin is warm.     Coloration: Skin is not jaundiced.  Findings: No lesion or rash.  Neurological:     General: No focal deficit present.     Mental Status: He is alert and oriented to person, place, and time. Mental status is at baseline.  Psychiatric:        Mood and Affect: Mood normal.        Behavior: Behavior normal.        Thought Content: Thought content normal.    LABS:   CBC Latest Ref Rng & Units 08/18/2021 08/09/2021 05/26/2021  WBC 4.0 - 10.5 K/uL 5.2 7.8 5.9  Hemoglobin 13.0 - 17.0 g/dL 13.9 13.3 13.9  Hematocrit 39.0 - 52.0 % 41.7 40.7 41.8  Platelets 150 - 400 K/uL 277 296.0 247.0   CMP Latest Ref Rng & Units 08/18/2021 08/09/2021 05/26/2021  Glucose 70 - 99 mg/dL 119(H) 100(H) 95  BUN 6 - 20 mg/dL 16 18 17   Creatinine 0.61 - 1.24 mg/dL 0.83 0.76 0.83  Sodium 135 - 145 mmol/L 138 136 139  Potassium 3.5 - 5.1 mmol/L 4.7 4.3 5.0  Chloride 98 - 111 mmol/L 101 100 99  CO2 22 - 32 mmol/L 29 30 32  Calcium 8.9 - 10.3 mg/dL 9.5 9.6 9.9  Total Protein 6.5 - 8.1 g/dL 7.5 6.9 7.2  Total Bilirubin 0.3 - 1.2 mg/dL 0.5 0.4 0.4  Alkaline Phos 38 - 126 U/L 70 78 75  AST 15 - 41 U/L 20 15 22   ALT 0 - 44 U/L 14 10 18     Latest Reference Range & Units 08/09/21 10:49  CA 19-9 <34 U/mL 1,981 (H)  (H): Data is  abnormally high  STUDIES:  CT Abdomen Pelvis W Contrast  Result Date: 08/18/2021 CLINICAL DATA:  Acute abdominal pain. EXAM: CT ABDOMEN AND PELVIS WITH CONTRAST TECHNIQUE: Multidetector CT imaging of the abdomen and pelvis was performed using the standard protocol following bolus administration of intravenous contrast. RADIATION DOSE REDUCTION: This exam was performed according to the departmental dose-optimization program which includes automated exposure control, adjustment of the mA and/or kV according to patient size and/or use of iterative reconstruction technique. CONTRAST:  170mL OMNIPAQUE IOHEXOL 300 MG/ML  SOLN COMPARISON:  CT abdomen pelvis dated 08/06/2021. FINDINGS: Lower chest: Partially visualized 5 mm nodule in the lingula, present on the prior CT. The visualized lung bases are otherwise clear. No intra-abdominal free air or free fluid. Hepatobiliary: No focal liver abnormality is seen. No gallstones, gallbladder wall thickening, or biliary dilatation. Pancreas: A 2.7 x 2.9 cm hypoenhancing lesion in the body of the pancreas with atrophy of the distal gland and dilatation of the upstream main pancreatic duct most consistent with adenocarcinoma. Further characterization with MRI or endoscopy ultrasound, if not previously performed, is recommended. This lesion measures approximately 2.4 x 2.1 cm on the prior CT by my measurements. Spleen: Normal in size without focal abnormality. Adrenals/Urinary Tract: The adrenal glands unremarkable. The kidneys, visualized ureters, and urinary bladder appear unremarkable. Stomach/Bowel: There is moderate amount of stool throughout the colon. There is no bowel obstruction or active inflammation. The appendix is normal. Vascular/Lymphatic: The abdominal aorta and IVC are unremarkable. There is a left sided IVC anatomy. No portal venous gas. The SMV, splenic vein, and main portal vein are patent. There is however focal area of compression of the splenic vein  adjacent to the mass. No definite adenopathy. A subcentimeter lymph node anterior to the pancreatic mass measures 7 mm short axis. Reproductive: The prostate and seminal vesicles are grossly unremarkable. No pelvic mass. Other: None  Musculoskeletal: No acute or significant osseous findings. IMPRESSION: 1. Enlarging pancreatic mass most consistent with adenocarcinoma. 2. Focal area of compression of the splenic vein adjacent to the mass. The splenic vein remains patent. 3. No bowel obstruction. Normal appendix. 4. Partially visualized 5 mm nodule in the lingula, present on the prior CT. Electronically Signed   By: Anner Crete M.D.   On: 08/18/2021 19:19     ASSESSMENT & PLAN:  A 41 y.o. male who I was asked to consult upon for what clinically appears to be stage IB (T2 N0 M0) pancreatic adenocarcinoma.  In clinic today, I went over his recent scans and biopsy results with him, for which he could see that he has no obvious evidence of metastatic disease.  Theoretically, the goal would be to cure him of his disease.  I did speak with general surgery about his case.  After reviewing his scans, they felt it would be best to give him neoadjuvant chemotherapy before proceeding with definitive surgery.  I will arrange for this gentleman to begin FOLFIRINOX, for which he would receive up to 4 cycles of chemotherapy.  Afterwards, repeat CT scans would be done to ascertain his new disease baseline.  I will arrange for this gentleman to have a port placed, through which his neoadjuvant chemotherapy would be given.  Based upon his recent scans, the concern is he may have acute pancreatitis.  Ultimately, this will need to subside before he commences with his 1st cycle of neoadjuvant chemotherapy.   One major issue that will have to be addressed as he goes through his neoadjuvant chemotherapy is his heavy drug use.  I made it very clear to the patient that continued drug use will impair his ability to get through  chemotherapy, as well as affect him potentially being healthy enough to make it through very demanding pancreatic surgery.  Overall, I am very concerned his daily lifestyle will put serve as a significant impediment to giving him all the therapy he needs to ultimately cure him of his pancreatic cancer.  Despite his history of illicit drug use, this gentleman clearly appears to be in abdominal pain, with scans showing evidence of a significant fluid collection adjacent to his spleen that is likely reflective of another bout of acute pancreatitis.  For now, I will place this gentleman on fentanyl 50 mcg patches, which he will change every 3 days.  He already has a prescription for Percocet 10/325, for which he can take 1 to 2 tablets every 4-6 hours as needed for breakthrough pain.  If there is any evidence of polypharmacy/drug abuse occurring, re-prescribing of all his pain medications will not be done.  The patient understands all the plans discussed today and is in agreement with them.  I do appreciate Milus Banister, MD for his new consult.   Chesnie Capell Macarthur Critchley, MD

## 2021-08-23 NOTE — Progress Notes (Signed)
START ON PATHWAY REGIMEN - Pancreatic Adenocarcinoma     A cycle is every 14 days:     Oxaliplatin      Leucovorin      Irinotecan      Fluorouracil   **Always confirm dose/schedule in your pharmacy ordering system**  Patient Characteristics: Preoperative (Clinical Staging), Resectable, Neoadjuvant Therapy Planned, BRCA1/2 and PALB2 Mutation Absent/Unknown Therapeutic Status: Preoperative (Clinical Staging) AJCC T Category: cT2 AJCC N Category: cN0 Resectability Status: Resectable AJCC M Category: cM0 AJCC 8 Stage Grouping: IB BRCA1/2 Mutation Status: Quantity Not Sufficient PALB2 Mutation Status: Quantity Not Sufficient Intent of Therapy: Curative Intent, Discussed with Patient

## 2021-08-24 ENCOUNTER — Observation Stay (HOSPITAL_COMMUNITY)
Admission: EM | Admit: 2021-08-24 | Discharge: 2021-08-25 | Disposition: A | Discharge status: Home / Self Care | Payer: Medicare Other | Attending: Internal Medicine | Admitting: Internal Medicine

## 2021-08-24 ENCOUNTER — Other Ambulatory Visit: Payer: Self-pay

## 2021-08-24 ENCOUNTER — Emergency Department (HOSPITAL_COMMUNITY): Payer: Medicare Other

## 2021-08-24 ENCOUNTER — Encounter (HOSPITAL_COMMUNITY): Payer: Self-pay

## 2021-08-24 DIAGNOSIS — R1010 Upper abdominal pain, unspecified: Secondary | ICD-10-CM

## 2021-08-24 DIAGNOSIS — Z20822 Contact with and (suspected) exposure to covid-19: Secondary | ICD-10-CM | POA: Diagnosis not present

## 2021-08-24 DIAGNOSIS — R109 Unspecified abdominal pain: Secondary | ICD-10-CM | POA: Diagnosis present

## 2021-08-24 DIAGNOSIS — K219 Gastro-esophageal reflux disease without esophagitis: Secondary | ICD-10-CM | POA: Insufficient documentation

## 2021-08-24 DIAGNOSIS — C259 Malignant neoplasm of pancreas, unspecified: Secondary | ICD-10-CM | POA: Insufficient documentation

## 2021-08-24 HISTORY — DX: Malignant (primary) neoplasm, unspecified: C80.1

## 2021-08-24 LAB — COMPREHENSIVE METABOLIC PANEL
ALT: 15 U/L (ref 0–44)
AST: 19 U/L (ref 15–41)
Albumin: 4.3 g/dL (ref 3.5–5.0)
Alkaline Phosphatase: 73 U/L (ref 38–126)
Anion gap: 9 (ref 5–15)
BUN: 14 mg/dL (ref 6–20)
CO2: 29 mmol/L (ref 22–32)
Calcium: 9.3 mg/dL (ref 8.9–10.3)
Chloride: 99 mmol/L (ref 98–111)
Creatinine, Ser: 0.85 mg/dL (ref 0.61–1.24)
GFR, Estimated: >60 mL/min (ref >60)
Glucose, Bld: 104 mg/dL — ABNORMAL HIGH (ref 70–99)
Potassium: 3.7 mmol/L (ref 3.5–5.1)
Sodium: 137 mmol/L (ref 135–145)
Total Bilirubin: 0.4 mg/dL (ref 0.3–1.2)
Total Protein: 7.2 g/dL (ref 6.5–8.1)

## 2021-08-24 LAB — CBC WITH DIFFERENTIAL/PLATELET
Abs Immature Granulocytes: 0.03 10*3/uL (ref 0.00–0.07)
Basophils Absolute: 0 10*3/uL (ref 0.0–0.1)
Basophils Relative: 0 %
Eosinophils Absolute: 0.2 10*3/uL (ref 0.0–0.5)
Eosinophils Relative: 2 %
HCT: 40.5 % (ref 39.0–52.0)
Hemoglobin: 13.7 g/dL (ref 13.0–17.0)
Immature Granulocytes: 0 %
Lymphocytes Relative: 23 %
Lymphs Abs: 2 10*3/uL (ref 0.7–4.0)
MCH: 31.9 pg (ref 26.0–34.0)
MCHC: 33.8 g/dL (ref 30.0–36.0)
MCV: 94.4 fL (ref 80.0–100.0)
Monocytes Absolute: 0.5 10*3/uL (ref 0.1–1.0)
Monocytes Relative: 6 %
Neutro Abs: 6 10*3/uL (ref 1.7–7.7)
Neutrophils Relative %: 69 %
Platelets: 260 10*3/uL (ref 150–400)
RBC: 4.29 MIL/uL (ref 4.22–5.81)
RDW: 12.5 % (ref 11.5–15.5)
WBC: 8.7 10*3/uL (ref 4.0–10.5)
nRBC: 0 % (ref 0.0–0.2)

## 2021-08-24 LAB — URINALYSIS, ROUTINE W REFLEX MICROSCOPIC
Bilirubin Urine: NEGATIVE
Glucose, UA: NEGATIVE mg/dL
Hgb urine dipstick: NEGATIVE
Ketones, ur: NEGATIVE mg/dL
Leukocytes,Ua: NEGATIVE
Nitrite: NEGATIVE
Protein, ur: NEGATIVE mg/dL
Specific Gravity, Urine: 1.021 (ref 1.005–1.030)
pH: 8 (ref 5.0–8.0)

## 2021-08-24 LAB — PROTIME-INR
INR: 0.9 (ref 0.8–1.2)
Prothrombin Time: 12.2 seconds (ref 11.4–15.2)

## 2021-08-24 LAB — RESP PANEL BY RT-PCR (FLU A&B, COVID) ARPGX2
Influenza A by PCR: NEGATIVE
Influenza B by PCR: NEGATIVE
SARS Coronavirus 2 by RT PCR: NEGATIVE

## 2021-08-24 LAB — LIPASE, BLOOD: Lipase: 51 U/L (ref 11–51)

## 2021-08-24 MED ORDER — HYDROMORPHONE HCL 1 MG/ML IJ SOLN
1.0000 mg | Freq: Once | INTRAMUSCULAR | Status: AC
  Administered 2021-08-24: 1 mg via INTRAVENOUS
  Filled 2021-08-24: qty 1

## 2021-08-24 MED ORDER — ACETAMINOPHEN 650 MG RE SUPP: 650.0000 mg | Freq: Four times a day (QID) | RECTAL | Status: DC | PRN

## 2021-08-24 MED ORDER — ONDANSETRON HCL 4 MG/2ML IJ SOLN
4.0000 mg | Freq: Once | INTRAMUSCULAR | Status: AC
  Administered 2021-08-24: 4 mg via INTRAVENOUS
  Filled 2021-08-24: qty 2

## 2021-08-24 MED ORDER — ENOXAPARIN SODIUM 40 MG/0.4ML IJ SOSY
40.0000 mg | PREFILLED_SYRINGE | INTRAMUSCULAR | Status: DC
  Filled 2021-08-24: qty 0.4

## 2021-08-24 MED ORDER — SENNOSIDES-DOCUSATE SODIUM 8.6-50 MG PO TABS: 2.0000 | ORAL_TABLET | Freq: Every day | ORAL | Status: DC

## 2021-08-24 MED ORDER — HYDROMORPHONE HCL 1 MG/ML IJ SOLN
1.0000 mg | INTRAMUSCULAR | Status: DC | PRN
  Administered 2021-08-24 – 2021-08-25 (×5): 1 mg via INTRAVENOUS
  Filled 2021-08-24 (×5): qty 1

## 2021-08-24 MED ORDER — ONDANSETRON HCL 4 MG/2ML IJ SOLN
4.0000 mg | Freq: Four times a day (QID) | INTRAMUSCULAR | Status: DC | PRN
  Administered 2021-08-24: 4 mg via INTRAVENOUS
  Filled 2021-08-24: qty 2

## 2021-08-24 MED ORDER — NALOXONE HCL 0.4 MG/ML IJ SOLN: 0.4000 mg | INTRAMUSCULAR | Status: DC | PRN

## 2021-08-24 MED ORDER — SODIUM CHLORIDE 0.9 % IV BOLUS
1000.0000 mL | Freq: Once | INTRAVENOUS | Status: AC
  Administered 2021-08-24: 1000 mL via INTRAVENOUS

## 2021-08-24 MED ORDER — SENNOSIDES-DOCUSATE SODIUM 8.6-50 MG PO TABS
1.0000 | ORAL_TABLET | Freq: Every day | ORAL | Status: DC
  Administered 2021-08-24: 1 via ORAL
  Filled 2021-08-24: qty 1

## 2021-08-24 MED ORDER — OXYCODONE-ACETAMINOPHEN 5-325 MG PO TABS
1.0000 | ORAL_TABLET | Freq: Three times a day (TID) | ORAL | Status: DC | PRN
  Administered 2021-08-24: 2 via ORAL
  Filled 2021-08-24 (×2): qty 2

## 2021-08-24 MED ORDER — OXYCODONE-ACETAMINOPHEN 5-325 MG PO TABS
1.0000 | ORAL_TABLET | ORAL | Status: DC | PRN
  Administered 2021-08-25: 2 via ORAL
  Filled 2021-08-24: qty 2

## 2021-08-24 MED ORDER — ACETAMINOPHEN 325 MG PO TABS: 650.0000 mg | ORAL_TABLET | Freq: Four times a day (QID) | ORAL | Status: DC | PRN

## 2021-08-24 MED ORDER — LACTATED RINGERS IV SOLN: INTRAVENOUS | Status: AC

## 2021-08-24 MED ORDER — IOHEXOL 300 MG/ML  SOLN
70.0000 mL | Freq: Once | INTRAMUSCULAR | Status: AC | PRN
  Administered 2021-08-24: 70 mL via INTRAVENOUS

## 2021-08-24 NOTE — ED Notes (Signed)
Pt refused covid swab. °

## 2021-08-24 NOTE — ED Notes (Signed)
Pt eating meal at bedside.

## 2021-08-24 NOTE — ED Provider Notes (Signed)
[facility] Provider Note   CSN: [id number] Arrival date & time: 08/24/21  4742     History  Chief Complaint  Patient presents with   Abdominal Pain    Tony Elliott is a 41 y.o. male with a past medical history significant for recently diagnosed pancreatic cancer, chronic hepatitis C, marijuana abuse, alcohol abuse, bipolar disorder, and GERD who presents to the ED due to upper abdominal pain associated with 1 episode of nonbloody, nonbilious emesis that occurred early this morning.  Abdominal pain associated with "abdominal distention".  Patient notes immediate relief after episode of emesis earlier this morning however, pain quickly recurred.  Patient had a bowel movement yesterday which was normal.  He was recently seen in [place] ED due to epigastric pain and was diagnosed with acute pancreatitis.  Patient notes he has been sober for 5 years.  Denies urinary symptoms. No fever or chills. Denies diarrhea.  No previous abdominal operations. Denies chest pain and shortness of breath.   Chart reviewed.  Patient had an oncology appointment yesterday with Dr. Bobby Rumpf who discussed patient's endoscopy results.  Unfortunately, endoscopy results showed pancreatic adenocarcinoma.  No evidence thus far of metastatic disease.  Plan from Dr. Bobby Rumpf is to undergo neoadjuvant chemotherapy then surgery.  Yesterday, patient prescribed fentanyl 50 mcg patches every 3 days and 1-2 Percocets every 4-6 hours.  Patient has a history of polysubstance abuse and alcohol abuse.      Home Medications Prior to Admission medications   Medication Sig Start Date End Date Taking? Authorizing Provider  fentaNYL (DURAGESIC) 50 MCG/HR Place 1 patch onto the skin every 3 (three) days. 08/23/21  Yes Lewis, Dequincy A, MD  naloxone [facility]) nasal spray 4 mg/0.1 mL 1 spray into alternating nostrils once as needed (overdose) for up to 1 dose. One spray in either nostril once for  known/suspected opioid overdose. May repeat every 2-3 minutes in alternating nostril til EMS arrives 06/29/21  Yes [provider]  oxyCODONE-acetaminophen (PERCOCET) 10-325 MG tablet Take 1-2 tablets by mouth every 6 (six) hours as needed for pain. 08/19/21  Yes Milus Banister, MD  pantoprazole (PROTONIX) 40 MG tablet Take 1 tablet (40 mg total) by mouth daily. 05/26/21   Jackquline Denmark, MD      Allergies    Ketorolac tromethamine and Ultram [tramadol hcl]    Review of Systems   Review of Systems  Constitutional:  Negative for chills and fever.  Respiratory:  Negative for shortness of breath.   Cardiovascular:  Negative for chest pain.  Gastrointestinal:  Positive for abdominal distention, abdominal pain, nausea and vomiting. Negative for constipation and diarrhea.   Physical Exam Updated Vital Signs BP (!) 152/104 (BP Location: Right Arm)    Pulse 60    Temp 97.6 F (36.4 C)    Resp 15    Ht 5\' 3"  (1.6 m)    Wt 57.6 kg    SpO2 100%    BMI 22.50 kg/m  Physical Exam Vitals and nursing note reviewed.  Constitutional:      General: He is not in acute distress.    Appearance: He is not ill-appearing.  HENT:     Head: Normocephalic.  Eyes:     Pupils: Pupils are equal, round, and reactive to light.  Cardiovascular:     Rate and Rhythm: Normal rate and regular rhythm.     Pulses: Normal pulses.     Heart sounds: Normal heart sounds. No murmur heard.  No friction rub. No gallop.  Pulmonary:     Effort: Pulmonary effort is normal.     Breath sounds: Normal breath sounds.  Abdominal:     General: Abdomen is flat. There is no distension.     Palpations: Abdomen is soft.     Tenderness: There is abdominal tenderness. There is guarding. There is no rebound.     Comments: Diffuse tenderness, most significant in upper abdomen with voluntary guarding.  Musculoskeletal:        General: Normal range of motion.     Cervical back: Neck supple.  Skin:    General: Skin is warm and  dry.  Neurological:     General: No focal deficit present.     Mental Status: He is alert.  Psychiatric:        Mood and Affect: Mood normal.        Behavior: Behavior normal.    ED Results / Procedures / Treatments   Labs (all labs ordered are listed, but only abnormal results are displayed) Labs Reviewed  COMPREHENSIVE METABOLIC PANEL - Abnormal; Notable for the following components:      Result Value   Glucose, Bld 104 (*)    All other components within normal limits  URINALYSIS, ROUTINE W REFLEX MICROSCOPIC - Abnormal; Notable for the following components:   APPearance CLOUDY (*)    All other components within normal limits  RESP PANEL BY RT-PCR (FLU A&B, COVID) ARPGX2  CBC WITH DIFFERENTIAL/PLATELET  LIPASE, BLOOD    EKG None  Radiology CT ABDOMEN PELVIS W CONTRAST  Result Date: 08/24/2021 CLINICAL DATA:  Nausea and vomiting. Recent endoscopic ultrasound on 08/19/2021 for pancreatic mass with cytology revealing malignant cells consistent with adenocarcinoma. EXAM: CT ABDOMEN AND PELVIS WITH CONTRAST TECHNIQUE: Multidetector CT imaging of the abdomen and pelvis was performed using the standard protocol following bolus administration of intravenous contrast. RADIATION DOSE REDUCTION: This exam was performed according to the departmental dose-optimization program which includes automated exposure control, adjustment of the mA and/or kV according to patient size and/or use of iterative reconstruction technique. CONTRAST:  61mL OMNIPAQUE IOHEXOL 300 MG/ML  SOLN COMPARISON:  Prior CT of the abdomen and pelvis on 08/22/2021 [facility] and 08/18/2021 at [place]: Lower chest: No acute abnormality. Hepatobiliary: No focal liver abnormality is seen. No gallstones, gallbladder wall thickening, or biliary dilatation. Pancreas: Stable appearance of low-attenuation mass within the body of the pancreas measuring roughly 2.7 cm in diameter and with associated atrophy and ductal  dilatation within the distal body and tail. The lentiform fluid collection posterior to the stomach in the lesser sac seen on the 08/22/2021 scan has essentially resolved with a small amount of edema and fluid remaining. No new fluid collections identified. Spleen: Normal in size without focal abnormality. Adrenals/Urinary Tract: Adrenal glands are unremarkable. Kidneys are normal, without renal calculi, focal lesion, or hydronephrosis. Bladder is unremarkable. Stomach/Bowel: Bowel shows no evidence of obstruction, ileus, inflammation or lesion. There is moderate stool again noted throughout most of the colon without significant focal fecal impaction. No free intraperitoneal air identified. 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 No venous thrombosis related to the pancreatic mass. The mass does abut a segment of the splenic vein. Stable mildly prominent peripancreatic and gastrohepatic lymph nodes again noted suspicious for metastatic involvement. The largest medial to the pancreatic body and in the celiac/periportal region measures approximately 13 mm in short axis. Reproductive: Prostate is unremarkable. Other: No  abdominal wall hernia or abnormality. No abdominopelvic ascites. Musculoskeletal: No acute or significant osseous findings. IMPRESSION: 1. Resolving fluid collection in the lesser sac between the posterior stomach and spleen noted on the prior scan after recent endoscopic ultrasound and biopsy of the pancreas. A mild amount of edema of remains. 2. Stable appearance roughly 3 cm mass of the body of the pancreas consistent with known adenocarcinoma. 3. Stable mildly enlarged peripancreatic, celiac and gastrohepatic lymph nodes suspicious for metastatic involvement. Electronically Signed   By: Aletta Edouard M.D.   On: 08/24/2021 10:49    Procedures Procedures    Medications Ordered in ED Medications  sodium chloride 0.9 % bolus 1,000  mL (0 mLs Intravenous Stopped 08/24/21 1126)  HYDROmorphone (DILAUDID) injection 1 mg (1 mg Intravenous Given 08/24/21 0824)  ondansetron (ZOFRAN) injection 4 mg (4 mg Intravenous Given 08/24/21 0821)  HYDROmorphone (DILAUDID) injection 1 mg (1 mg Intravenous Given 08/24/21 0959)  iohexol (OMNIPAQUE) 300 MG/ML solution 70 mL (70 mLs Intravenous Contrast Given 08/24/21 1022)  HYDROmorphone (DILAUDID) injection 1 mg (1 mg Intravenous Given 08/24/21 1220)    ED Course/ Medical Decision Making/ A&P Clinical Course as of 08/24/21 1250  Tue Aug 24, 2021  0754 WBC: 8.7 [CA]  0908 Hemoglobin: 13.7 [CA]  0931 Lipase: 51 [CA]  0931 AST: 19 [CA]  0931 ALT: 15 [CA]  0931 Alkaline Phosphatase: 73 [CA]  0956 CT ABDOMEN PELVIS W CONTRAST [MH]    Clinical Course User Index [CA] Suzy Bouchard, PA-C [MH] Lawerance Bach                           Medical Decision Making Amount and/or Complexity of Data Reviewed External Data Reviewed: notes.    Details: oncology notes reviewed Labs: ordered. Decision-making details documented in ED Course. Radiology: ordered and independent interpretation performed. Decision-making details documented in ED Course. ECG/medicine tests: ordered and independent interpretation performed. Decision-making details documented in ED Course.  Risk Prescription drug management. Decision regarding hospitalization.  41 year old male with a history of recently diagnosed pancreatic cancer presents to the ED due to upper abdominal pain associated with 1 episode of nonbloody, nonbilious emesis that occurred this morning.  Patient recently seen in [facility] ED and was diagnosed with acute pancreatitis.  Patient saw Dr. Bobby Rumpf with oncology yesterday with plans to start chemotherapy followed by surgery in the future.  Patient has a history of polysubstance abuse.  No fever or chills.  Upon arrival, stable vitals.  Patient in no acute distress; however, appears uncomfortable  in bed.  Abdomen soft, nondistended with diffuse tenderness most significant in the upper abdomen with voluntary guarding.  Given patient's significant pain and known pancreatic mass will obtain CT abdomen to rule out infection, bowel obstruction, worsening pancreatitis, etc.  IV fluids, Dilaudid, and Zofran given. EKG to rule out atypical ACS; however my suspicion is lower for cardiac etiology given patient's history.  CBC unremarkable.  No leukocytosis and normal hemoglobin.  Lipase normal at 51.  Low suspicion for pancreatitis.  CMP reassuring.  Normal renal function.  No major electrolyte derangements. CT abdomen personally visualized which demonstrates ongoing fluid collection, stable 3 cm pancreatic mass, enlarged lymph nodes concerning for metastatic disease.  EKG personally reviewed which demonstrates NSR with no signs of acute ischemia. Given patient's intractable pain will discuss with oncology for further treatment plan.  11:00 AM reassessed patient at bedside after 2nd round of dilaudid. He notes his pain is  unchanged. He rates his pain a 10/10.   12:15 PM discussed case with Dr. Bobby Rumpf, patient's oncologist who notes patient may benefit from IR consultation for possible celiac plexus nerve block. Will discuss with hospitalist for admission for intractable pain  12:38 PM Discussed with Dr. Laural Golden who agrees to admit patient. Will reach out to IR to discuss possible nerve block.  1:02 PM Discussed with Dr. Annamaria Boots with IR who recommends placing a consult for IR treatment and one of his colleagues who is familiar with celiac plexus nerve blocks will evaluate patient. Consult placed.         Final Clinical Impression(s) / ED Diagnoses Final diagnoses:  Pain of upper abdomen  Malignant neoplasm of pancreas, unspecified location of malignancy [facility])    Rx / DC Orders ED Discharge Orders     None         Suzy Bouchard, PA-C 08/24/21 1305    Lennice Sites, DO 08/24/21  1530

## 2021-08-24 NOTE — ED Notes (Signed)
Pt transported to CT ?

## 2021-08-24 NOTE — ED Notes (Signed)
Wasted 2 percocet pills (5-325 each) in blue zone med room (pt refused pills after opening); Carmelia Bake, RN, witness

## 2021-08-24 NOTE — ED Notes (Signed)
Pt given [place] sandwich; lunch tray ordered

## 2021-08-24 NOTE — ED Notes (Signed)
Patient transported to CT 

## 2021-08-24 NOTE — ED Notes (Signed)
Pt requested Miralax to not be constipated. Notified MD

## 2021-08-24 NOTE — ED Notes (Signed)
Pt ambulated to restroom with steady gait. Provided urinal to collect sample. Pt verbalized understanding.

## 2021-08-24 NOTE — ED Notes (Signed)
Pt still unable to provide a urine sample at this time

## 2021-08-24 NOTE — H&P (Addendum)
Date: 08/24/2021               Patient Name:  Tony Elliott MRN: [id number]  DOB: January 10, 1981 Age / Sex: 42 y.o., male   PCP: Imagene Riches, NP         Medical Service: Internal Medicine Teaching Service         Attending Physician: Dr. Velna Ochs, MD    First Contact: Dr. Elliot Gurney Pager: [phone]  Second Contact: Dr. Eulas Post Pager: [phone]       After Hours (After 5p/  First Contact Pager: [phone]  weekends / holidays): Second Contact Pager: [phone]   Chief Complaint: Abdominal pain  History of Present Illness: Tony Elliott is a 41 year old male with a medical history significant for chronic hepatitis C, bipolar disorder, GERD, substance abuse disorder and recently diagnosed pancreatic cancer.  He presents today with acute upper abdominal pain associated with 1 episode of nonbloody nonbilious emesis that occurred earlier today.  He felt immediate relief after he vomited this morning however pain quickly returned.  He feels is abdomen is "swollen".  He was recently diagnosed with acute pancreatitis at the [facility] emergency department.  Denies any fevers, chills, diarrhea or constipation. States this is the first time he has had vomiting since his abdominal pain started a few months ago. Denies any decreased appetite or weight loss. He has a history of polysubstance and alcohol abuse however has been sober for the past 5 years.  Patient established with an oncologist yesterday, Dr. Bobby Rumpf where his results from a recent endoscopy which showed a pancreatic adenocarcinoma were discussed.  He had no evidence of further metastatic disease.  Plan is to undergo neoadjuvant chemotherapy followed by surgery.  He was prescribed fentanyl 50 mcg patches every 3 days and 1-2 Percocets every 4-6 hours.  He states that this was helping his pain up until this morning when he had the episode of emesis.  After this states his pain recurred and was persisted.  ER provider discussed case with  patient's oncologist who recommended a celiac nerve block to help alleviate pain.    Meds:  Current Meds  Medication Sig   fentaNYL (DURAGESIC) 50 MCG/HR Place 1 patch onto the skin every 3 (three) days.   naloxone (NARCAN) nasal spray 4 mg/0.1 mL 1 spray into alternating nostrils once as needed (overdose) for up to 1 dose. One spray in either nostril once for known/suspected opioid overdose. May repeat every 2-3 minutes in alternating nostril til EMS arrives   oxyCODONE-acetaminophen (PERCOCET) 10-325 MG tablet Take 1-2 tablets by mouth every 6 (six) hours as needed for pain.     Allergies: Allergies as of 08/24/2021 - Review Complete 08/24/2021  Allergen Reaction Noted   Ketorolac tromethamine Other (See Comments) 09/21/2011   Ultram [tramadol hcl] Other (See Comments) 09/21/2011   Past Medical History:  Diagnosis Date   Anxiety    Bipolar disorder ([place])    Cancer (HCC)    Chronic hepatitis C (HCC)    Depression    GERD (gastroesophageal reflux disease)    H. pylori infection    History of respiratory failure    Hypercholesterolemia    Intentional drug overdose ([place])    with multiple suicidal gestures in the past   PTSD (post-traumatic stress disorder)    Thyroid disease     Family History:  Family History  Problem Relation Age of Onset   Heart disease Mother    High blood pressure Mother  Diabetes Mother    Colon cancer Neg Hx    Pancreatic cancer Neg Hx    Rectal cancer Neg Hx    Stomach cancer Neg Hx      Social History: Lives alone in [place].  States he has been on disability since the age of 38 after his dad died.  States he has a history of polysubstance abuse but has been sober for over 5 years.  Review of Systems: A complete ROS was negative except as per HPI.   Physical Exam: Blood pressure (!) 146/110, pulse (!) 56, temperature 97.6 F (36.4 C), resp. rate (!) 24, height 5\' 3"  (1.6 m), weight 57.6 kg, SpO2 100 %. Physical Exam General: alert,  appears stated age, in no acute distress HEENT: Normocephalic, atraumatic, EOM intact, conjunctiva normal CV: Regular rate and rhythm, no murmurs rubs or gallops Pulm: Clear to auscultation bilaterally, normal work of breathing Abdomen: Soft, nondistended, bowel sounds present, tenderness to palpation in the epigastric region and left upper quadrant MSK: No lower extremity edema Skin: Warm and dry Neuro: Alert and oriented x3   EKG: personally reviewed my interpretation is sinus rhythm  CXR: personally reviewed my interpretation is n/a  CT abdomen pelvis with contrast IMPRESSION: 1. Resolving fluid collection in the lesser sac between the posterior stomach and spleen noted on the prior scan after recent endoscopic ultrasound and biopsy of the pancreas. A mild amount of edema of remains. 2. Stable appearance roughly 3 cm mass of the body of the pancreas consistent with known adenocarcinoma. 3. Stable mildly enlarged peripancreatic, celiac and gastrohepatic lymph nodes suspicious for metastatic involvement.  Assessment & Plan by Problem: Principal Problem:   Pancreatic adenocarcinoma [facility])  Pancreatic adenocarcinoma Patient was recently diagnosed with pancreatic adenocarcinoma which appears stable on CT abdomen.  He just established with an oncologist, Dr. Bobby Rumpf, and [facility] yesterday.  Plan is to proceed with neoadjuvant chemotherapy followed with surgical resection.  Patient was started on fentanyl patches and oxycodone yesterday for pain control.  States that this was initially helping his pain however this morning had an episode of emesis followed by recurrent and persistent abdominal pain.  He has received IV Dilaudid since arrival to the ER without change in his abdominal pain.  Repeat CT abdomen showed resolving fluid collection in the lesser sac with a mild amount of edema remaining.  Stable appearance of 3 cm mass at the body of the pancreas and stable lymph nodes suspicious for  metastatic involvement.  Case was discussed with patient's oncologist Dr. Bobby Rumpf who recommended a celiac plexus nerve block to help with pain control.  Patient does have a history of significant polysubstance abuse so there was concern about tolerance to pain medications ; of note however has been sober for over 5 years.  Discussed with patient and states that he consider this once he is provided with more information.  On examination he had significant tenderness to the epigastric region and left upper quadrant.  He was given several doses of IV Dilaudid and appeared drowsy with respiratory rate ranging from 8-12.  Will hold off on additional pain medications at this time.  Once patient is more alert and respiratory rate improves can restart home medications for pain control. -Consult IR for possible celiac plexus nerve block -Resume home pain medications once patient is more alert and respiratory rate improved -Narcan as needed  Polysubstance abuse Patient reported he has been sober for 5 years from both alcohol and illicit drug use. Per chart  review, he did endorse using crystal methamphetamine 4 times daily along with daily marijuana use. He was counseled on the importance of cessation by his oncologist.  -Continue to encourage cessation from all illicit substances   GERD -Continue home Protonix  Diet: Regular VTE prophylaxis: Lovenox Full code  Dispo: Admit patient to Observation with expected length of stay less than 2 midnights.  SignedMike Craze, DO 08/24/2021, 2:04 PM  Pager: [phone] After 5pm on weekdays and 1pm on weekends: On Call pager: [phone]

## 2021-08-24 NOTE — ED Notes (Addendum)
Fentanyl patch removed from left upper arm per [place], [place]

## 2021-08-24 NOTE — ED Notes (Signed)
Pt ambulated to restroom with steady gait.

## 2021-08-24 NOTE — ED Notes (Signed)
Pt aware urine sample is needed; pt unable to provide at this time

## 2021-08-24 NOTE — Consult Note (Signed)
° °  Chief Complaint: Abdominal pain. Request is for celiac plexus block. Case approved by Dr. Dwaine Gale on 2.21.23. Patient refusing procedure at this time.   Referring Physician(s): Dr. Elwin Sleight / Charmaine Downs PA  Supervising Physician: Mir, Sharen Heck  Patient Status: [facility] - ED  Assessment and Plan:  41 y.o. male inpatient (In ED). History of GERD, PTSD, bipolar, polysubstance use, recently diagnosed pancreatic adenocarcinoma and pancreatitis . Presented to the ED at [facility] on 2.21.23 with upper abdominal pain. Team is requesting a celiac plexus block for pain control.   All labs and medications are within acceptable parameters. Allergies include ultram. CT abd pelvic from 2.21.23.  IR consulted for possible celiac block. Case has been reviewed and procedure approved by IR Attending Dr. Kristeen Miss.  Patient seen at bedside in the ED. Procedure explained in detail. Procedure described to the Patient in detail starting with anatomy of celiac plexus with images shown.  Explained to the Patient that this procedure was specifically for pain control purposes. Patient states that he is not agreeing to any additional procedures after the biopsy he had performed during  EUS.   Should the Patient decide to procedure with the procedure please re-consult IR.   Thank you for this interesting consult.  I greatly enjoyed meeting Tony Elliott and look forward to participating in their care.  A copy of this report was sent to the requesting provider on this date.  Electronically Signed: Jacqualine Mau, NP 08/24/2021, 2:43 PM

## 2021-08-24 NOTE — ED Triage Notes (Addendum)
Bib ems from home; c/o 1 episode vomiting, yellow, rancid smelling, and constant abd pain x 1 year; pt had biopsy on 16th for evaluation of mass on pancreas; states his oncologist told him he has fluid that needs to be drained; 160/100, 82 HR, 100% RA; fentanyl patch in place from home supply

## 2021-08-25 DIAGNOSIS — C259 Malignant neoplasm of pancreas, unspecified: Secondary | ICD-10-CM

## 2021-08-25 LAB — CBC WITH DIFFERENTIAL/PLATELET
Abs Immature Granulocytes: 0.01 10*3/uL (ref 0.00–0.07)
Basophils Absolute: 0 10*3/uL (ref 0.0–0.1)
Basophils Relative: 0 %
Eosinophils Absolute: 0.2 10*3/uL (ref 0.0–0.5)
Eosinophils Relative: 4 %
HCT: 37.8 % — ABNORMAL LOW (ref 39.0–52.0)
Hemoglobin: 12.8 g/dL — ABNORMAL LOW (ref 13.0–17.0)
Immature Granulocytes: 0 %
Lymphocytes Relative: 36 %
Lymphs Abs: 1.9 10*3/uL (ref 0.7–4.0)
MCH: 32.1 pg (ref 26.0–34.0)
MCHC: 33.9 g/dL (ref 30.0–36.0)
MCV: 94.7 fL (ref 80.0–100.0)
Monocytes Absolute: 0.4 10*3/uL (ref 0.1–1.0)
Monocytes Relative: 7 %
Neutro Abs: 2.8 10*3/uL (ref 1.7–7.7)
Neutrophils Relative %: 53 %
Platelets: 225 10*3/uL (ref 150–400)
RBC: 3.99 MIL/uL — ABNORMAL LOW (ref 4.22–5.81)
RDW: 12.4 % (ref 11.5–15.5)
WBC: 5.2 10*3/uL (ref 4.0–10.5)
nRBC: 0 % (ref 0.0–0.2)

## 2021-08-25 LAB — BASIC METABOLIC PANEL
Anion gap: 8 (ref 5–15)
BUN: 12 mg/dL (ref 6–20)
CO2: 29 mmol/L (ref 22–32)
Calcium: 8.8 mg/dL — ABNORMAL LOW (ref 8.9–10.3)
Chloride: 97 mmol/L — ABNORMAL LOW (ref 98–111)
Creatinine, Ser: 0.82 mg/dL (ref 0.61–1.24)
GFR, Estimated: >60 mL/min (ref >60)
Glucose, Bld: 110 mg/dL — ABNORMAL HIGH (ref 70–99)
Potassium: 4.1 mmol/L (ref 3.5–5.1)
Sodium: 134 mmol/L — ABNORMAL LOW (ref 135–145)

## 2021-08-25 LAB — HIV ANTIBODY (ROUTINE TESTING W REFLEX): HIV Screen 4th Generation wRfx: NONREACTIVE

## 2021-08-25 MED ORDER — OXYCODONE-ACETAMINOPHEN 5-325 MG PO TABS
2.0000 | ORAL_TABLET | ORAL | Status: DC | PRN
  Filled 2021-08-25: qty 2

## 2021-08-25 MED ORDER — HYDROMORPHONE HCL 1 MG/ML IJ SOLN: 1.0000 mg | INTRAMUSCULAR | Status: DC | PRN

## 2021-08-25 MED ORDER — OXYCODONE-ACETAMINOPHEN 5-325 MG PO TABS: 2.0000 | ORAL_TABLET | ORAL | Status: DC | PRN

## 2021-08-25 MED ORDER — MORPHINE SULFATE ER 30 MG PO TBCR: 30.0000 mg | EXTENDED_RELEASE_TABLET | Freq: Two times a day (BID) | ORAL | Status: DC

## 2021-08-25 NOTE — Discharge Instructions (Signed)
Dear Tony Elliott,  We saw you in the hospital for belly pain. Please follow up with your cancer doctor.

## 2021-08-25 NOTE — ED Notes (Signed)
Checked patient blood sugar notified RN of the sugar check patient is resting with call bell in reach

## 2021-08-25 NOTE — ED Notes (Signed)
Nurse stated she will pull labs off line.

## 2021-08-25 NOTE — Progress Notes (Incomplete)
HD#0 SUBJECTIVE:  Patient Summary: Tony Elliott is a 41 y.o. with a pertinent PMH of ***, who presented with *** and admitted for ***.   Overnight Events: ***    Interm History: ***  OBJECTIVE:  Vital Signs: Vitals:   08/25/21 0615 08/25/21 0630 08/25/21 0645 08/25/21 0700  BP: (!) 148/108 (!) 152/108 (!) 148/105 (!) 149/116  Pulse: (!) 57 (!) 56 71 (!) 54  Resp: 13 13 15 13   Temp:      SpO2: 100% 100% 100% 100%  Weight:      Height:       Supplemental O2: {NAMES:3044014::"Room Air","Nasal Cannula","Simple Face Mask","Partial Rebreather","HFNC","Non Rebreather","Venturi Mask","Bag Valve Mask"} SpO2: 100 %  Filed Weights   08/24/21 0713  Weight: 57.6 kg     Intake/Output Summary (Last 24 hours) at 08/25/2021 0808 Last data filed at 08/25/2021 0208 Gross per 24 hour  Intake 2000.57 ml  Output --  Net 2000.57 ml   Net IO Since Admission: 2,000.57 mL [08/25/21 0808]  Physical Exam: Physical Exam  Patient Lines/Drains/Airways Status     Active Line/Drains/Airways     Name Placement date Placement time Site Days   Peripheral IV 08/24/21 20 G Right Antecubital 08/24/21  0817  Antecubital  1   AIRWAYS 08/19/21  0817  -- 6   AIRWAYS 08/19/21  0817  -- 6            Pertinent Labs: CBC Latest Ref Rng & Units 08/24/2021 08/18/2021 08/09/2021  WBC 4.0 - 10.5 K/uL 8.7 5.2 7.8  Hemoglobin 13.0 - 17.0 g/dL 13.7 13.9 13.3  Hematocrit 39.0 - 52.0 % 40.5 41.7 40.7  Platelets 150 - 400 K/uL 260 277 296.0    CMP Latest Ref Rng & Units 08/24/2021 08/18/2021 08/09/2021  Glucose 70 - 99 mg/dL 104(H) 119(H) 100(H)  BUN 6 - 20 mg/dL 14 16 18   Creatinine 0.61 - 1.24 mg/dL 0.85 0.83 0.76  Sodium 135 - 145 mmol/L 137 138 136  Potassium 3.5 - 5.1 mmol/L 3.7 4.7 4.3  Chloride 98 - 111 mmol/L 99 101 100  CO2 22 - 32 mmol/L 29 29 30   Calcium 8.9 - 10.3 mg/dL 9.3 9.5 9.6  Total Protein 6.5 - 8.1 g/dL 7.2 7.5 6.9  Total Bilirubin 0.3 - 1.2 mg/dL 0.4 0.5 0.4  Alkaline Phos 38 -  126 U/L 73 70 78  AST 15 - 41 U/L 19 20 15   ALT 0 - 44 U/L 15 14 10     No results for input(s): GLUCAP in the last 72 hours.   Pertinent Imaging: CT ABDOMEN PELVIS W CONTRAST  Result Date: 08/24/2021 CLINICAL DATA:  Nausea and vomiting. Recent endoscopic ultrasound on 08/19/2021 for pancreatic mass with cytology revealing malignant cells consistent with adenocarcinoma. EXAM: CT ABDOMEN AND PELVIS WITH CONTRAST TECHNIQUE: Multidetector CT imaging of the abdomen and pelvis was performed using the standard protocol following bolus administration of intravenous contrast. RADIATION DOSE REDUCTION: This exam was performed according to the departmental dose-optimization program which includes automated exposure control, adjustment of the mA and/or kV according to patient size and/or use of iterative reconstruction technique. CONTRAST:  1mL OMNIPAQUE IOHEXOL 300 MG/ML  SOLN COMPARISON:  Prior CT of the abdomen and pelvis on 08/22/2021 [facility] and 08/18/2021 at [place]: Lower chest: No acute abnormality. Hepatobiliary: No focal liver abnormality is seen. No gallstones, gallbladder wall thickening, or biliary dilatation. Pancreas: Stable appearance of low-attenuation mass within the body of the pancreas measuring roughly 2.7 cm in  diameter and with associated atrophy and ductal dilatation within the distal body and tail. The lentiform fluid collection posterior to the stomach in the lesser sac seen on the 08/22/2021 scan has essentially resolved with a small amount of edema and fluid remaining. No new fluid collections identified. Spleen: Normal in size without focal abnormality. Adrenals/Urinary Tract: Adrenal glands are unremarkable. Kidneys are normal, without renal calculi, focal lesion, or hydronephrosis. Bladder is unremarkable. Stomach/Bowel: Bowel shows no evidence of obstruction, ileus, inflammation or lesion. There is moderate stool again noted throughout most of the colon without  significant focal fecal impaction. No free intraperitoneal air identified. 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 No venous thrombosis related to the pancreatic mass. The mass does abut a segment of the splenic vein. Stable mildly prominent peripancreatic and gastrohepatic lymph nodes again noted suspicious for metastatic involvement. The largest medial to the pancreatic body and in the celiac/periportal region measures approximately 13 mm in short axis. Reproductive: Prostate is unremarkable. Other: No abdominal wall hernia or abnormality. No abdominopelvic ascites. Musculoskeletal: No acute or significant osseous findings. IMPRESSION: 1. Resolving fluid collection in the lesser sac between the posterior stomach and spleen noted on the prior scan after recent endoscopic ultrasound and biopsy of the pancreas. A mild amount of edema of remains. 2. Stable appearance roughly 3 cm mass of the body of the pancreas consistent with known adenocarcinoma. 3. Stable mildly enlarged peripancreatic, celiac and gastrohepatic lymph nodes suspicious for metastatic involvement. Electronically Signed   By: Aletta Edouard M.D.   On: 08/24/2021 10:49    ASSESSMENT/PLAN:  Assessment: Principal Problem:   Pancreatic adenocarcinoma [facility])  Pancreatic adenocarcinoma Recently diagnosed pancreatic adenocarcinoma  Patient continues to have pan left side of abd. Not vomited since yesterday. IR had discussed with patient regarding nerve block which he declined due to concerns over complications.   Signature: Delene Ruffini, MD  Internal Medicine Resident, PGY-1 [facility]  Pager: [phone] 8:08 AM, 08/25/2021   Please contact the on call pager after 5 pm and on weekends at [phone].

## 2021-08-25 NOTE — Discharge Summary (Signed)
Name: Tony Elliott MRN: [id number] DOB: [birth year] 41 y.o. PCP: Imagene Riches, NP  Date of Admission: 08/24/2021  7:08 AM Date of Discharge: 08/25/21 Attending Physician: Dr. Philipp Ovens  Discharge Diagnosis: Principal Problem:   Pancreatic adenocarcinoma [facility])    Discharge Medications: Allergies as of 08/25/2021       Reactions   Ketorolac Tromethamine Other (See Comments)   "makes me angry"   Ultram [tramadol Hcl] Other (See Comments)   "makes me angry"        Medication List     TAKE these medications    naloxone 4 MG/0.1ML Liqd nasal spray kit Commonly known as: NARCAN 1 spray into alternating nostrils once as needed (overdose) for up to 1 dose. One spray in either nostril once for known/suspected opioid overdose. May repeat every 2-3 minutes in alternating nostril til EMS arrives   oxyCODONE-acetaminophen 10-325 MG tablet Commonly known as: PERCOCET Take 1-2 tablets by mouth every 6 (six) hours as needed for pain.   pantoprazole 40 MG tablet Commonly known as: PROTONIX Take 1 tablet (40 mg total) by mouth daily.        Disposition and follow-up:   Mr.Tony Elliott was discharged from [facility] in Stable condition.  At the hospital follow up visit please address:  1.  Follow-up:  a. Pancreatic adenocarcinoma - patient presented with complaints of pain and vomiting despite outpatient regimen. Declined changes in oral pain medications or nerve block    b. GERD - protonix   2.  Labs / imaging needed at time of follow-up: bmp  3.  Pending labs/ test needing follow-up: none  4.  Medication Changes  Started: none  Stopped: none  Changed: none  Follow-up Appointments: Oncology  Hospital Course by problem list:  Pancreatic adenocarcinoma - Patient presented to the hospital with complaints of vomiting and abdominal pain. He received IV dilaudid after arrival in the ED without change in pain. CT abd was repeated  which showed resolving  fluid collection. Case was discussed with oncologist who recommended celiac plexus nerve clock for pain control. IR was consulted for nerve block. Pt declined nerve block. During morning rounds the following day after admission, patient reported he was feeling comfortable again enough to continue outpatient pain regimen. He also stated that he felt as though he was wasting staff's time. He was discharged from the ED. Patient left before receiving further information, education, or treatment. He had been advised during rounds prior to leaving that follow up with his oncologist for future treatment would be necessary.   No notes on file   Discharge Subjective: Patient reporting pain and vomiting has resolved. Does not want nerve block. He states he feels as though he is wasting our time. Feels okay to continue outpatient regimen.   Discharge Exam:   BP (!) 149/116    Pulse (!) 54    Temp 97.6 F (36.4 C)    Resp 13    Ht 5' 3"  (1.6 m)    Wt 57.6 kg    SpO2 100%    BMI 22.50 kg/m  Constitutional: well-appearing male sitting in bed, appears uncomfortable HENT: normocephalic atraumatic, mucous membranes moist Eyes: conjunctiva non-erythematous Neck: supple Cardiovascular: regular rate and rhythm, no m/r/g Pulmonary/Chest: normal work of breathing on room air, lungs clear to auscultation bilaterally Abdominal: soft, non-tender, non-distended MSK: normal bulk and tone Neurological: alert & oriented x 3, 5/5 strength in bilateral upper and lower extremities, normal gait Skin: warm and dry  Pertinent Labs, Studies, and Procedures:  CBC Latest Ref Rng & Units 08/25/2021 08/24/2021 08/18/2021  WBC 4.0 - 10.5 K/uL 5.2 8.7 5.2  Hemoglobin 13.0 - 17.0 g/dL 12.8(L) 13.7 13.9  Hematocrit 39.0 - 52.0 % 37.8(L) 40.5 41.7  Platelets 150 - 400 K/uL 225 260 277    CMP Latest Ref Rng & Units 08/25/2021 08/24/2021 08/18/2021  Glucose 70 - 99 mg/dL 110(H) 104(H) 119(H)  BUN 6 - 20 mg/dL 12 14 16   Creatinine 0.61  - 1.24 mg/dL 0.82 0.85 0.83  Sodium 135 - 145 mmol/L 134(L) 137 138  Potassium 3.5 - 5.1 mmol/L 4.1 3.7 4.7  Chloride 98 - 111 mmol/L 97(L) 99 101  CO2 22 - 32 mmol/L 29 29 29   Calcium 8.9 - 10.3 mg/dL 8.8(L) 9.3 9.5  Total Protein 6.5 - 8.1 g/dL - 7.2 7.5  Total Bilirubin 0.3 - 1.2 mg/dL - 0.4 0.5  Alkaline Phos 38 - 126 U/L - 73 70  AST 15 - 41 U/L - 19 20  ALT 0 - 44 U/L - 15 14    CT ABDOMEN PELVIS W CONTRAST  Result Date: 08/24/2021 CLINICAL DATA:  Nausea and vomiting. Recent endoscopic ultrasound on 08/19/2021 for pancreatic mass with cytology revealing malignant cells consistent with adenocarcinoma. EXAM: CT ABDOMEN AND PELVIS WITH CONTRAST TECHNIQUE: Multidetector CT imaging of the abdomen and pelvis was performed using the standard protocol following bolus administration of intravenous contrast. RADIATION DOSE REDUCTION: This exam was performed according to the departmental dose-optimization program which includes automated exposure control, adjustment of the mA and/or kV according to patient size and/or use of iterative reconstruction technique. CONTRAST:  68m OMNIPAQUE IOHEXOL 300 MG/ML  SOLN COMPARISON:  [facility] [place] Lower chest: No acute abnormality. Hepatobiliary: No focal liver abnormality is seen. No gallstones, gallbladder wall thickening, or biliary dilatation. Pancreas: Stable appearance of low-attenuation mass within the body of the pancreas measuring roughly 2.7 cm in diameter and with associated atrophy and ductal dilatation within the distal body and tail. The lentiform fluid collection posterior to the stomach in the lesser sac seen on the 08/22/2021 scan has essentially resolved with a small amount of edema and fluid remaining. No new fluid collections identified. Spleen: Normal in size without focal abnormality. Adrenals/Urinary Tract: Adrenal glands are unremarkable. Kidneys are  normal, without renal calculi, focal lesion, or hydronephrosis. Bladder is unremarkable. Stomach/Bowel: Bowel shows no evidence of obstruction, ileus, inflammation or lesion. There is moderate stool again noted throughout most of the colon without significant focal fecal impaction. No free intraperitoneal air identified. 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 No venous thrombosis related to the pancreatic mass. The mass does abut a segment of the splenic vein. Stable mildly prominent peripancreatic and gastrohepatic lymph nodes again noted suspicious for metastatic involvement. The largest medial to the pancreatic body and in the celiac/periportal region measures approximately 13 mm in short axis. Reproductive: Prostate is unremarkable. Other: No abdominal wall hernia or abnormality. No abdominopelvic ascites. Musculoskeletal: No acute or significant osseous findings. IMPRESSION: 1. Resolving fluid collection in the lesser sac between the posterior stomach and spleen noted on the prior scan after recent endoscopic ultrasound and biopsy of the pancreas. A mild amount of edema of remains. 2. Stable appearance roughly 3 cm mass of the body of the pancreas consistent with known adenocarcinoma. 3. Stable mildly enlarged peripancreatic, celiac and gastrohepatic lymph  nodes suspicious for metastatic involvement. Electronically Signed   By: Aletta Edouard M.D.   On: 08/24/2021 10:49     Discharge Instructions: Discharge Instructions     Call MD for:  difficulty breathing, headache or visual disturbances   Complete by: As directed    Call MD for:  extreme fatigue   Complete by: As directed    Call MD for:  hives   Complete by: As directed    Call MD for:  persistant dizziness or light-headedness   Complete by: As directed    Call MD for:  persistant nausea and vomiting   Complete by: As directed    Call MD for:  redness, tenderness, or signs of  infection (pain, swelling, redness, odor or green/yellow discharge around incision site)   Complete by: As directed    Call MD for:  severe uncontrolled pain   Complete by: As directed    Call MD for:  temperature >100.4   Complete by: As directed    Diet - low sodium heart healthy   Complete by: As directed    Increase activity slowly   Complete by: As directed        Signed: Delene Ruffini, MD 08/25/2021, 7:58 PM   Pager: [phone]

## 2021-08-25 NOTE — ED Notes (Signed)
Patient removed his own IV and left without AVS, without reviewing discharge instructions, and without allowing discharge vital signs.

## 2021-08-30 ENCOUNTER — Ambulatory Visit: Payer: Medicare Other | Admitting: Oncology

## 2021-08-30 ENCOUNTER — Inpatient Hospital Stay: Payer: Medicare Other

## 2021-08-30 ENCOUNTER — Telehealth: Payer: Self-pay

## 2021-08-30 ENCOUNTER — Other Ambulatory Visit: Payer: Self-pay | Admitting: Hematology and Oncology

## 2021-08-30 MED ORDER — OXYCODONE HCL 15 MG PO TABS: 15.0000 mg | ORAL_TABLET | Freq: Four times a day (QID) | ORAL | 0 refills | Status: DC | PRN

## 2021-08-30 NOTE — Telephone Encounter (Signed)
@  1406  I spoke with pt's mom, Tony Elliott. [name] is not @ home right now. I told her of Dr Bobby Rumpf' plan for pain medication. Pt will get oxycodone 15mg  qty #56 every 2 weeks 1 tab po q 6hr PAIN. He can also use tylenol. Pt to continue using the Duragesic 11mcg patch & change q 72 hrs. Pt is to only get pain prescriptions from Dr Bobby Rumpf, & to only use the [facility] on [facility]. For pain medication.  Tony Elliott voiced understanding.  Tony Elliott also mentioned that [name] is not scheduled for the port placement, because he was scared. I told her that [name] must get port placement before any chemo can be started. She will talk to him again.    @1325 , after [facility], I spoke with Dr Bobby Rumpf regarding pt's pain meds. Pt has NO breakthrough pain medication. He only has fentanyl 50 mcg patch.  Dr Bobby Rumpf agrees to send in oxycodone 15mg  1 tab po q 6hr PRN pain qty #56 for 2 week supply at a time. Pt is NOT to call any other physician for pain medication, or Dr Bobby Rumpf will not fill.    Received call from Sam,RN, with [facility] agency, called to ask for some help. Pt has been in the ER 11 times in the month of February. His pain is uncontrolled. He has received a couple of pain prescriptions that the [facility] pharmacist can see that were actually filled 1 prescription was 7 day supply. 1 prescription was 9 day supply Percocet from Dr Ardis Hughs on 08/19/21). Pt only has the Fentanyl 61mcg patch that Dr Bobby Rumpf wrote. There have been 2 prescriptions (oxycodone 5 pills and few dilaudid from provider [facility]  in ER) that have been sent, & declined by the pharmacies to fill. Oxycodone had been sent & declined at [facility] and [facility])

## 2021-08-31 ENCOUNTER — Telehealth: Payer: Self-pay

## 2021-08-31 NOTE — Telephone Encounter (Addendum)
09/02/2021 I spoke with pt's mom, Tony Elliott. I wanted to make sure she was aware of appt for chemo ed, and chemotherapy. I also wanted to verify that Tony Elliott did put the Duragesic patch back on yesterday afternoon. She states he did. I told her the prescriptions for nausea are @ the [facility] [place], and he can alternate them to treat nausea PRN. I reinforced that he should only take 1 pain pill every 6 hrs, as written, because Dr Bobby Rumpf will not give him extra's. He will only give him 56 tabs q 2wks, so he will have to make do with OTC pain meds if he runs out sooner. She verbalized understanding. She states, "maybe once they get the chemo started, his pain will get better".   09/01/21 Tony Elliott, pt's mom called today. They saw the surgeon who will eventually perform surgery after chemo if everything goes as planned. He is meeting with Dr Coralie Keens on Friday to sign for port and tentative placement on Tuesday. Tony Elliott asking how soon they could starts [name] chemo? I told her I would talk to Dr Bobby Rumpf and staff in morning and return her call.    08/31/21 - Pt's mom called & stated that Tony Elliott is now ready for the doctor to put the port in. She is asking if we can call the surgeon and let them know. I forwarded the message to Tammy,CMA.

## 2021-09-01 ENCOUNTER — Other Ambulatory Visit: Payer: Self-pay | Admitting: Surgery

## 2021-09-01 ENCOUNTER — Ambulatory Visit
Admission: RE | Admit: 2021-09-01 | Discharge: 2021-09-01 | Disposition: A | Discharge status: Home / Self Care | Payer: Medicare Other | Source: Ambulatory Visit | Attending: Surgery | Admitting: Surgery

## 2021-09-01 ENCOUNTER — Encounter: Payer: Self-pay | Admitting: Oncology

## 2021-09-01 ENCOUNTER — Other Ambulatory Visit: Payer: Self-pay | Admitting: Hematology and Oncology

## 2021-09-01 DIAGNOSIS — C251 Malignant neoplasm of body of pancreas: Secondary | ICD-10-CM

## 2021-09-01 MED ORDER — ONDANSETRON HCL 4 MG PO TABS: 4.0000 mg | ORAL_TABLET | ORAL | 3 refills | Status: DC | PRN

## 2021-09-01 MED ORDER — PROCHLORPERAZINE MALEATE 10 MG PO TABS: 10.0000 mg | ORAL_TABLET | Freq: Four times a day (QID) | ORAL | 3 refills | Status: DC | PRN

## 2021-09-01 MED ORDER — IOPAMIDOL (ISOVUE-300) INJECTION 61%
75.0000 mL | Freq: Once | INTRAVENOUS | Status: AC | PRN
  Administered 2021-09-01: 75 mL via INTRAVENOUS

## 2021-09-02 ENCOUNTER — Encounter: Payer: Self-pay | Admitting: Oncology

## 2021-09-02 ENCOUNTER — Telehealth: Payer: Self-pay | Admitting: Oncology

## 2021-09-02 ENCOUNTER — Other Ambulatory Visit: Payer: Self-pay | Admitting: Pharmacist

## 2021-09-02 DIAGNOSIS — C251 Malignant neoplasm of body of pancreas: Secondary | ICD-10-CM

## 2021-09-02 NOTE — Telephone Encounter (Signed)
Patient has been scheduled for Chemo education and labs. Aware of appt date and time. ? ?Upcoming chemo appts have also been scheduled. ? ?

## 2021-09-03 ENCOUNTER — Inpatient Hospital Stay: Payer: Medicare Other | Attending: Hematology and Oncology | Admitting: Hematology and Oncology

## 2021-09-03 ENCOUNTER — Encounter: Payer: Self-pay | Admitting: Hematology and Oncology

## 2021-09-03 ENCOUNTER — Inpatient Hospital Stay: Payer: Medicare Other

## 2021-09-03 VITALS — BP 173/107 | HR 81 | Temp 98.1°F | Resp 20 | Ht 62.0 in | Wt 128.7 lb

## 2021-09-03 DIAGNOSIS — R197 Diarrhea, unspecified: Secondary | ICD-10-CM | POA: Insufficient documentation

## 2021-09-03 DIAGNOSIS — Z79899 Other long term (current) drug therapy: Secondary | ICD-10-CM | POA: Insufficient documentation

## 2021-09-03 DIAGNOSIS — E7800 Pure hypercholesterolemia, unspecified: Secondary | ICD-10-CM | POA: Insufficient documentation

## 2021-09-03 DIAGNOSIS — K1230 Oral mucositis (ulcerative), unspecified: Secondary | ICD-10-CM | POA: Insufficient documentation

## 2021-09-03 DIAGNOSIS — F4310 Post-traumatic stress disorder, unspecified: Secondary | ICD-10-CM | POA: Insufficient documentation

## 2021-09-03 DIAGNOSIS — C251 Malignant neoplasm of body of pancreas: Secondary | ICD-10-CM | POA: Insufficient documentation

## 2021-09-03 DIAGNOSIS — B182 Chronic viral hepatitis C: Secondary | ICD-10-CM | POA: Insufficient documentation

## 2021-09-03 DIAGNOSIS — K5900 Constipation, unspecified: Secondary | ICD-10-CM | POA: Insufficient documentation

## 2021-09-03 DIAGNOSIS — K219 Gastro-esophageal reflux disease without esophagitis: Secondary | ICD-10-CM | POA: Insufficient documentation

## 2021-09-03 DIAGNOSIS — R112 Nausea with vomiting, unspecified: Secondary | ICD-10-CM | POA: Insufficient documentation

## 2021-09-03 DIAGNOSIS — G629 Polyneuropathy, unspecified: Secondary | ICD-10-CM | POA: Insufficient documentation

## 2021-09-03 DIAGNOSIS — E079 Disorder of thyroid, unspecified: Secondary | ICD-10-CM | POA: Insufficient documentation

## 2021-09-03 DIAGNOSIS — R630 Anorexia: Secondary | ICD-10-CM | POA: Insufficient documentation

## 2021-09-03 DIAGNOSIS — Z5111 Encounter for antineoplastic chemotherapy: Secondary | ICD-10-CM | POA: Insufficient documentation

## 2021-09-03 DIAGNOSIS — F17210 Nicotine dependence, cigarettes, uncomplicated: Secondary | ICD-10-CM | POA: Insufficient documentation

## 2021-09-03 LAB — CBC AND DIFFERENTIAL
HCT: 38 — AB (ref 41–53)
Hemoglobin: 13.3 — AB (ref 13.5–17.5)
Neutrophils Absolute: 3.76
Platelets: 320 (ref 150–399)
WBC: 6.6

## 2021-09-03 LAB — BASIC METABOLIC PANEL
BUN: 24 — AB (ref 4–21)
CO2: 28 — AB (ref 13–22)
Chloride: 104 (ref 99–108)
Creatinine: 0.7 (ref 0.6–1.3)
Glucose: 94
Potassium: 4.3 (ref 3.4–5.3)
Sodium: 139 (ref 137–147)

## 2021-09-03 LAB — HEPATIC FUNCTION PANEL
ALT: 17 (ref 10–40)
AST: 25 (ref 14–40)
Alkaline Phosphatase: 70 (ref 25–125)
Bilirubin, Total: 0.4

## 2021-09-03 LAB — CBC: RBC: 4.11 (ref 3.87–5.11)

## 2021-09-03 LAB — COMPREHENSIVE METABOLIC PANEL
Albumin: 4.5 (ref 3.5–5.0)
Calcium: 8.9 (ref 8.7–10.7)

## 2021-09-03 MED ORDER — LOPERAMIDE HCL 2 MG PO CAPS: ORAL_CAPSULE | ORAL | 0 refills | Status: DC

## 2021-09-03 MED ORDER — ONDANSETRON HCL 8 MG PO TABS: 8.0000 mg | ORAL_TABLET | Freq: Two times a day (BID) | ORAL | 1 refills | Status: DC | PRN

## 2021-09-03 MED ORDER — PROCHLORPERAZINE MALEATE 10 MG PO TABS: 10.0000 mg | ORAL_TABLET | Freq: Four times a day (QID) | ORAL | 1 refills | Status: DC | PRN

## 2021-09-03 MED ORDER — DEXAMETHASONE 4 MG PO TABS: 8.0000 mg | ORAL_TABLET | Freq: Every day | ORAL | 5 refills | Status: DC

## 2021-09-03 MED ORDER — FENTANYL 12 MCG/HR TD PT72: 1.0000 | MEDICATED_PATCH | TRANSDERMAL | 0 refills | Status: DC

## 2021-09-03 NOTE — Progress Notes (Cosign Needed)
[place] NOTE  Patient Care Team: Imagene Riches, NP as PCP - General   Name of the patient: Tony Elliott  [id number]  April 02, 1981   Date of visit: 09/03/21  Diagnosis- Pancreatic cancer  Chief complaint/Reason for visit- Initial Meeting for [facility], preparing for starting chemotherapy   Heme/Onc history:  Oncology History  Pancreatic cancer ([place])  08/19/2021 Initial Diagnosis   Pancreatic cancer ([place])   08/23/2021 Cancer Staging   Staging form: Exocrine Pancreas, AJCC 8th Edition - Clinical stage from 08/23/2021: Stage IB (cT2, cN0, cM0) - Signed by Marice Potter, MD on 08/23/2021 Histopathologic type: Mucinous adenocarcinoma Total positive nodes: 0    09/08/2021 -  Chemotherapy   Patient is on Treatment Plan : PANCREAS Modified FOLFIRINOX q14d x 4 cycles       Interval history-  Patient presents to chemo care clinic today for initial meeting in preparation for starting chemotherapy. I introduced the chemo care clinic and we discussed that the role of the clinic is to assist those who are at an increased risk of emergency room visits and/or complications during the course of chemotherapy treatment. We discussed that the increased risk takes into account factors such as age, performance status, and co-morbidities. We also discussed that for some, this might include barriers to care such as not having a primary care provider, lack of insurance/transportation, or not being able to afford medications. We discussed that the goal of the program is to help prevent unplanned ER visits and help reduce complications during chemotherapy. We do this by discussing specific risk factors to each individual and identifying ways that we can help improve these risk factors and reduce barriers to care.   Allergies  Allergen Reactions   Ketorolac Tromethamine Other (See Comments)    "makes me angry"   Ultram [Tramadol Hcl] Other (See Comments)    "makes me angry"    Ketorolac Tromethamine Other (See Comments)    "makes me angry" "makes me angry"   Tramadol Other (See Comments)    "makes me angry" "makes me angry"    Past Medical History:  Diagnosis Date   Anxiety    Bipolar disorder ([place])    Cancer ([place])    Chronic hepatitis C ([facility])    Depression    GERD (gastroesophageal reflux disease)    H. pylori infection    History of respiratory failure    Hypercholesterolemia    Intentional drug overdose ([place])    with multiple suicidal gestures in the past   PTSD (post-traumatic stress disorder)    Thyroid disease     Past Surgical History:  Procedure Laterality Date   ESOPHAGOGASTRODUODENOSCOPY  11/19/2010   Normal EGD   ESOPHAGOGASTRODUODENOSCOPY (EGD) WITH PROPOFOL N/A 08/19/2021   Procedure: ESOPHAGOGASTRODUODENOSCOPY (EGD) WITH PROPOFOL;  Surgeon: Milus Banister, MD;  Location: [facility];  Service: Endoscopy;  Laterality: N/A;   EUS N/A 08/19/2021   Procedure: UPPER ENDOSCOPIC ULTRASOUND (EUS) RADIAL;  Surgeon: Milus Banister, MD;  Location: [facility];  Service: Endoscopy;  Laterality: N/A;   FINE NEEDLE ASPIRATION N/A 08/19/2021   Procedure: FINE NEEDLE ASPIRATION (FNA) LINEAR;  Surgeon: Milus Banister, MD;  [facility];  Service: Endoscopy;  Laterality: N/A;    Social History   Socioeconomic History   Marital status: Single    Spouse name: Not on file   Number of children: Not on file   Years of education: Not on file   Highest education level: Not  on file  Occupational History   Not on file  Tobacco Use   Smoking status: Every Day   Smokeless tobacco: Never  Vaping Use   Vaping Use: Never used  Substance and Sexual Activity   Alcohol use: No   Drug use: Yes    Types: Marijuana   Sexual activity: Not on file  Other Topics Concern   Not on file  Social History Narrative   ** Merged History Encounter **       Social Determinants of Health   Financial Resource Strain: Not on file  Food Insecurity:  Not on file  Transportation Needs: Not on file  Physical Activity: Not on file  Stress: Not on file  Social Connections: Not on file  Intimate Partner Violence: Not on file    Family History  Problem Relation Age of Onset   Heart disease Mother    High blood pressure Mother    Diabetes Mother    Colon cancer Neg Hx    Pancreatic cancer Neg Hx    Rectal cancer Neg Hx    Stomach cancer Neg Hx      Current Outpatient Medications:    dexamethasone (DECADRON) 4 MG tablet, Take 2 tablets (8 mg total) by mouth daily. Start the day after chemotherapy for 3 days. Take with food., Disp: 8 tablet, Rfl: 5   fentaNYL (DURAGESIC) 50 MCG/HR, Place 1 patch onto the skin every 3 (three) days., Disp: , Rfl:    loperamide (IMODIUM) 2 MG capsule, Take 2 at onset of diarrhea, then 1 every 2hrs until 12hr without a BM. May take 2 tab every 4hrs at bedtime. If diarrhea recurs repeat., Disp: 30 capsule, Rfl: 0   naloxone (NARCAN) nasal spray 4 mg/0.1 mL, 1 spray into alternating nostrils once as needed (overdose) for up to 1 dose. One spray in either nostril once for known/suspected opioid overdose. May repeat every 2-3 minutes in alternating nostril til EMS arrives, Disp: , Rfl:    ondansetron (ZOFRAN) 4 MG tablet, Take 1 tablet (4 mg total) by mouth every 4 (four) hours as needed for nausea., Disp: 90 tablet, Rfl: 3   ondansetron (ZOFRAN) 8 MG tablet, Take 1 tablet (8 mg total) by mouth 2 (two) times daily as needed. Start on day 3 after chemotherapy., Disp: 30 tablet, Rfl: 1   oxyCODONE (ROXICODONE) 15 MG immediate release tablet, Take 1 tablet (15 mg total) by mouth every 6 (six) hours as needed for pain., Disp: 56 tablet, Rfl: 0   pantoprazole (PROTONIX) 40 MG tablet, Take 1 tablet (40 mg total) by mouth daily., Disp: 30 tablet, Rfl: 6   prochlorperazine (COMPAZINE) 10 MG tablet, Take 1 tablet (10 mg total) by mouth every 6 (six) hours as needed for nausea or vomiting., Disp: 90 tablet, Rfl: 3    prochlorperazine (COMPAZINE) 10 MG tablet, Take 1 tablet (10 mg total) by mouth every 6 (six) hours as needed (Nausea or vomiting)., Disp: 30 tablet, Rfl: 1  CMP Latest Ref Rng & Units 08/25/2021  Glucose 70 - 99 mg/dL 110(H)  BUN 6 - 20 mg/dL 12  Creatinine 0.61 - 1.24 mg/dL 0.82  Sodium 135 - 145 mmol/L 134(L)  Potassium 3.5 - 5.1 mmol/L 4.1  Chloride 98 - 111 mmol/L 97(L)  CO2 22 - 32 mmol/L 29  Calcium 8.9 - 10.3 mg/dL 8.8(L)  Total Protein 6.5 - 8.1 g/dL -  Total Bilirubin 0.3 - 1.2 mg/dL -  Alkaline Phos 38 - 126 U/L -  AST  15 - 41 U/L -  ALT 0 - 44 U/L -   CBC Latest Ref Rng & Units 08/25/2021  WBC 4.0 - 10.5 K/uL 5.2  Hemoglobin 13.0 - 17.0 g/dL 12.8(L)  Hematocrit 39.0 - 52.0 % 37.8(L)  Platelets 150 - 400 K/uL 225    No images are attached to the encounter.  CT CHEST W CONTRAST  Result Date: 09/02/2021 CLINICAL DATA:  Nausea, vomiting. Newly diagnosed pancreatic cancer. EXAM: CT CHEST WITH CONTRAST TECHNIQUE: Multidetector CT imaging of the chest was performed during intravenous contrast administration. RADIATION DOSE REDUCTION: This exam was performed according to the departmental dose-optimization program which includes automated exposure control, adjustment of the mA and/or kV according to patient size and/or use of iterative reconstruction technique. CONTRAST:  69mL ISOVUE-300 IOPAMIDOL (ISOVUE-300) INJECTION 61% COMPARISON:  01/20/2021 FINDINGS: Cardiovascular: Heart is normal size. Aorta is normal caliber. Mediastinum/Nodes: No mediastinal, hilar, or axillary adenopathy. Trachea and esophagus are unremarkable. Thyroid unremarkable. Soft tissue in the anterior mediastinum felt to reflect residual thymus. Lungs/Pleura: Lingular nodule on image 93 measures 4 mm, stable since prior study. Right middle lobe nodule on image 91 measures 5 mm, stable since prior study. Right upper lobe nodule on image 68 measures 3 mm, stable. No effusions. No confluent airspace opacities. Upper  Abdomen: Low-density mass in the pancreatic body again noted, unchanged since recent abdominal CT. Associated distal pancreatic ductal dilatation. Musculoskeletal: Chest wall soft tissues are unremarkable. No acute bony abnormality. IMPRESSION: Three nodules seen within the lungs measuring up to 5 mm. These are unchanged since prior chest CT. No acute cardiopulmonary disease. Electronically Signed   By: Rolm Baptise M.D.   On: 09/02/2021 03:52   CT ABDOMEN PELVIS W CONTRAST  Result Date: 08/24/2021 CLINICAL DATA:  Nausea and vomiting. Recent endoscopic ultrasound on 08/19/2021 for pancreatic mass with cytology revealing malignant cells consistent with adenocarcinoma. EXAM: CT ABDOMEN AND PELVIS WITH CONTRAST TECHNIQUE: Multidetector CT imaging of the abdomen and pelvis was performed using the standard protocol following bolus administration of intravenous contrast. RADIATION DOSE REDUCTION: This exam was performed according to the departmental dose-optimization program which includes automated exposure control, adjustment of the mA and/or kV according to patient size and/or use of iterative reconstruction technique. CONTRAST:  35mL OMNIPAQUE IOHEXOL 300 MG/ML  SOLN COMPARISON:  Prior CT of the abdomen and pelvis on 08/22/2021 [facility] and 08/18/2021 at [place]: Lower chest: No acute abnormality. Hepatobiliary: No focal liver abnormality is seen. No gallstones, gallbladder wall thickening, or biliary dilatation. Pancreas: Stable appearance of low-attenuation mass within the body of the pancreas measuring roughly 2.7 cm in diameter and with associated atrophy and ductal dilatation within the distal body and tail. The lentiform fluid collection posterior to the stomach in the lesser sac seen on the 08/22/2021 scan has essentially resolved with a small amount of edema and fluid remaining. No new fluid collections identified. Spleen: Normal in size without focal abnormality. Adrenals/Urinary  Tract: Adrenal glands are unremarkable. Kidneys are normal, without renal calculi, focal lesion, or hydronephrosis. Bladder is unremarkable. Stomach/Bowel: Bowel shows no evidence of obstruction, ileus, inflammation or lesion. There is moderate stool again noted throughout most of the colon without significant focal fecal impaction. No free intraperitoneal air identified. 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 No venous thrombosis related to the pancreatic mass. The mass does abut a segment of the splenic vein. Stable mildly prominent peripancreatic and gastrohepatic lymph nodes again noted suspicious for metastatic  involvement. The largest medial to the pancreatic body and in the celiac/periportal region measures approximately 13 mm in short axis. Reproductive: Prostate is unremarkable. Other: No abdominal wall hernia or abnormality. No abdominopelvic ascites. Musculoskeletal: No acute or significant osseous findings. IMPRESSION: 1. Resolving fluid collection in the lesser sac between the posterior stomach and spleen noted on the prior scan after recent endoscopic ultrasound and biopsy of the pancreas. A mild amount of edema of remains. 2. Stable appearance roughly 3 cm mass of the body of the pancreas consistent with known adenocarcinoma. 3. Stable mildly enlarged peripancreatic, celiac and gastrohepatic lymph nodes suspicious for metastatic involvement. Electronically Signed   By: Aletta Edouard M.D.   On: 08/24/2021 10:49   CT Abdomen Pelvis W Contrast  Result Date: 08/18/2021 CLINICAL DATA:  Acute abdominal pain. EXAM: CT ABDOMEN AND PELVIS WITH CONTRAST TECHNIQUE: Multidetector CT imaging of the abdomen and pelvis was performed using the standard protocol following bolus administration of intravenous contrast. RADIATION DOSE REDUCTION: This exam was performed according to the departmental dose-optimization program which includes automated  exposure control, adjustment of the mA and/or kV according to patient size and/or use of iterative reconstruction technique. CONTRAST:  153mL OMNIPAQUE IOHEXOL 300 MG/ML  SOLN COMPARISON:  CT abdomen pelvis dated 08/06/2021. FINDINGS: Lower chest: Partially visualized 5 mm nodule in the lingula, present on the prior CT. The visualized lung bases are otherwise clear. No intra-abdominal free air or free fluid. Hepatobiliary: No focal liver abnormality is seen. No gallstones, gallbladder wall thickening, or biliary dilatation. Pancreas: A 2.7 x 2.9 cm hypoenhancing lesion in the body of the pancreas with atrophy of the distal gland and dilatation of the upstream main pancreatic duct most consistent with adenocarcinoma. Further characterization with MRI or endoscopy ultrasound, if not previously performed, is recommended. This lesion measures approximately 2.4 x 2.1 cm on the prior CT by my measurements. Spleen: Normal in size without focal abnormality. Adrenals/Urinary Tract: The adrenal glands unremarkable. The kidneys, visualized ureters, and urinary bladder appear unremarkable. Stomach/Bowel: There is moderate amount of stool throughout the colon. There is no bowel obstruction or active inflammation. The appendix is normal. Vascular/Lymphatic: The abdominal aorta and IVC are unremarkable. There is a left sided IVC anatomy. No portal venous gas. The SMV, splenic vein, and main portal vein are patent. There is however focal area of compression of the splenic vein adjacent to the mass. No definite adenopathy. A subcentimeter lymph node anterior to the pancreatic mass measures 7 mm short axis. Reproductive: The prostate and seminal vesicles are grossly unremarkable. No pelvic mass. Other: None Musculoskeletal: No acute or significant osseous findings. IMPRESSION: 1. Enlarging pancreatic mass most consistent with adenocarcinoma. 2. Focal area of compression of the splenic vein adjacent to the mass. The splenic vein  remains patent. 3. No bowel obstruction. Normal appendix. 4. Partially visualized 5 mm nodule in the lingula, present on the prior CT. Electronically Signed   By: Anner Crete M.D.   On: 08/18/2021 19:19     Assessment and plan- Patient is a 40 y.o. male who presents to [facility] for initial meeting in preparation for starting chemotherapy for the treatment of pancreatic cancer.   Chemo Care Clinic/High Risk for ER/Hospitalization during chemotherapy- We discussed the role of the chemo care clinic and identified patient specific risk factors. I discussed that patient was identified as high risk primarily based on:  Patient has past medical history positive for: Past Medical History:  Diagnosis Date   Anxiety  Bipolar disorder ([place])    Cancer ([place])    Chronic hepatitis C ([place])    Depression    GERD (gastroesophageal reflux disease)    H. pylori infection    History of respiratory failure    Hypercholesterolemia    Intentional drug overdose ([place])    with multiple suicidal gestures in the past   PTSD (post-traumatic stress disorder)    Thyroid disease     Patient has past surgical history positive for: Past Surgical History:  Procedure Laterality Date   ESOPHAGOGASTRODUODENOSCOPY  11/19/2010   Normal EGD   ESOPHAGOGASTRODUODENOSCOPY (EGD) WITH PROPOFOL N/A 08/19/2021   Procedure: ESOPHAGOGASTRODUODENOSCOPY (EGD) WITH PROPOFOL;  Surgeon: Milus Banister, MD;  [facility];  Service: Endoscopy;  Laterality: N/A;   EUS N/A 08/19/2021   Procedure: UPPER ENDOSCOPIC ULTRASOUND (EUS) RADIAL;  Surgeon: Milus Banister, MD;  [facility];  Service: Endoscopy;  Laterality: N/A;   FINE NEEDLE ASPIRATION N/A 08/19/2021   Procedure: FINE NEEDLE ASPIRATION (FNA) LINEAR;  Surgeon: Milus Banister, MD;  [facility];  Service: Endoscopy;  Laterality: N/A;   Provided general information including the following: 1.  Date of education: 09/03/2021 2.   Physician name: Dr. Bobby Rumpf 3.  Diagnosis: Pancreatic cancer 4.  Stage: Stage IB 5.  Curative  6.  Chemotherapy plan including drugs and how often: Leucovorin, Fluorouracil, Irinotecan, Oxaliplatin 7.  Start date: 09/08/2021 8.  Other referrals: None at this time 9.  The patient is to call our office with any questions or concerns.  Our office number [phone], if after hours or on the weekend, call the same number and wait for the answering service.  There is always an oncologist on call 10.  Medications prescribed: Prochlorperazine, Ondansetron, Dexamethasone 11.  The patient has verbalized understanding of the treatment plan and has no barriers to adherence or understanding.  Obtained signed consent from patient.  Discussed symptoms including 1.  Low blood counts including red blood cells, white blood cells and platelets. 2. Infection including to avoid large crowds, wash hands frequently, and stay away from people who were sick.  If fever develops of 100.4 or higher, call our office. 3.  Mucositis-given instructions on mouth rinse (baking soda and salt mixture).  Keep mouth clean.  Use soft bristle toothbrush.  If mouth sores develop, call our clinic. 4.  Nausea/vomiting-gave prescriptions for ondansetron 4 mg every 4 hours as needed for nausea, may take around the clock if persistent.  Compazine 10 mg every 6 hours, may take around the clock if persistent. 5.  Diarrhea-use over-the-counter Imodium.  Call clinic if not controlled. 6.  Constipation-use senna, 1 to 2 tablets twice a day.  If no BM in 2 to 3 days call the clinic. 7.  Loss of appetite-try to eat small meals every 2-3 hours.  Call clinic if not eating. 8.  Taste changes-zinc 500 mg daily.  If becomes severe call clinic. 9.  Alcoholic beverages. 10.  Drink 2 to 3 quarts of water per day. 11.  Peripheral neuropathy-patient to call if numbness or tingling in hands or feet is persistent  Neulasta-will be given 24 to 48 hours  after chemotherapy.  Gave information sheet on bone and joint pain.  Use Claritin or Pepcid.  May use ibuprofen or Aleve.  Call if symptoms persist or are unbearable.  Gave information on the supportive care team and how to contact them regarding services.  Discussed advanced directives.  The patient does not have their advanced  directives but will look at the copy provided in their notebook and will call with any questions. Spiritual Nutrition Financial Social worker Advanced directives  Answered questions to patient satisfaction.  Patient is to call with any further questions or concerns.  Time spent on this palliative care/chemotherapy education was 60 minutes with more than 50% spent discussing diagnosis, prognosis and symptom management.  The medication prescribed to the patient will be printed out from [email or web address] This will give the following information: Name of your medication Approved uses Dose and schedule Storage and handling Handling body fluids and waste Drug and food interactions Possible side effects and management Pregnancy, sexual activity, and contraception Obtaining medication   We discussed that social determinants of health may have significant impacts on health and outcomes for cancer patients.  Today we discussed specific social determinants of performance status, alcohol use, depression, financial needs, food insecurity, housing, interpersonal violence, social connections, stress, tobacco use, and transportation.    After lengthy discussion the following were identified as areas of need:   Outpatient services: We discussed options including home based and outpatient services, DME and care program. We discusssed that patients who participate in regular physical activity report fewer negative impacts of cancer and treatments and report less fatigue.   Financial Concerns: We discussed that living with cancer can create tremendous financial burden.  We  discussed options for assistance. I asked that if assistance is needed in affording medications or paying bills to please let us know so that we can provide assistance. We discussed options for food including social services and onsite food pantry.  We will also notify Mort Sawyers to see if cancer center can provide additional support.  Referral to Social work: Introduced Education officer, museum Mort Sawyers and the services she can provide such as support with utility bill, cell phone and gas vouchers.   Support groups: We discussed options for support groups at the cancer center. If interested, please notify nurse navigator to enroll. We discussed options for managing stress including healthy eating, exercise as well as participating in no charge counseling services at the cancer center and support groups.  If these are of interest, patient can notify either myself or primary nursing team.We discussed options for management including medications and referral to quit Smart program  Transportation: We discussed options for transportation.  I have notified primary oncology team who will help assist with arranging Lucianne Lei transportation for appointments when/if needed. We also discussed options for transportation on short notice/acute visits.  Palliative care services: We have palliative care services available in the cancer center to discuss goals of care and advanced care planning.  Please let us know if you have any questions or would like to speak to our palliative nurse practitioner.  Symptom Management Clinic: We discussed our symptom management clinic which is available for acute concerns while receiving treatment such as nausea, vomiting or diarrhea.  We can be reached via telephone at [phone] or through my chart.  We are available for virtual or in person visits on the same day from 830 to 4 PM Monday through Friday. He denies needing specific assistance at this time and He will be followed by Dr. Bobby Rumpf  clinical team.  Plan: Discussed symptom management clinic. Discussed palliative care services. Discussed resources that are available here at the cancer center. Discussed medications and new prescriptions to begin treatment such as anti-nausea or steroids.   Disposition: RTC on   Visit Diagnosis 1. Malignant neoplasm of body of pancreas ([place])  Patient expressed understanding and was in agreement with this plan. He also understands that He can call clinic at any time with any questions, concerns, or complaints.   I provided 30 minutes of  face to face  during this encounter, and > 50% was spent counseling as documented under my assessment & plan.   Dayton Scrape, FNP- [facility]

## 2021-09-07 ENCOUNTER — Telehealth: Payer: Self-pay

## 2021-09-07 MED FILL — Leucovorin Calcium For Inj 350 MG: INTRAMUSCULAR | Qty: 32 | Status: AC

## 2021-09-07 MED FILL — Irinotecan HCl Inj 100 MG/5ML (20 MG/ML): INTRAVENOUS | Qty: 12 | Status: AC

## 2021-09-07 MED FILL — Fosaprepitant Dimeglumine For IV Infusion 150 MG (Base Eq): INTRAVENOUS | Qty: 5 | Status: AC

## 2021-09-07 MED FILL — Fluorouracil IV Soln 5 GM/100ML (50 MG/ML): INTRAVENOUS | Qty: 77 | Status: AC

## 2021-09-07 MED FILL — Oxaliplatin IV Soln 100 MG/20ML: INTRAVENOUS | Qty: 27 | Status: AC

## 2021-09-07 MED FILL — Dexamethasone Sodium Phosphate Inj 100 MG/10ML: INTRAMUSCULAR | Qty: 1 | Status: AC

## 2021-09-07 NOTE — Telephone Encounter (Signed)
Pt's mom called to ask question about the fentanyl patches. She states she picked up the fentanyl patches today, but they are lower dose. I told her yes. The latest prescription for fentanyl 50mg is to be applied to skin, in addition to the fentanyl 543m patch and changed q 72hrs. Dr LeBobby Rumpfrdered 10- 502mpatches on 08/30/2021. Melissa Parsons,NP order 5 - [id number] on 09/03/2021 (which she just picked up today). ?

## 2021-09-08 ENCOUNTER — Other Ambulatory Visit: Payer: Self-pay | Admitting: Pharmacist

## 2021-09-08 ENCOUNTER — Other Ambulatory Visit: Payer: Self-pay

## 2021-09-08 ENCOUNTER — Inpatient Hospital Stay: Payer: Medicare Other

## 2021-09-08 ENCOUNTER — Ambulatory Visit: Payer: Medicare Other

## 2021-09-08 VITALS — BP 160/99 | HR 82 | Resp 18

## 2021-09-08 DIAGNOSIS — C251 Malignant neoplasm of body of pancreas: Secondary | ICD-10-CM | POA: Diagnosis present

## 2021-09-08 DIAGNOSIS — K5900 Constipation, unspecified: Secondary | ICD-10-CM | POA: Diagnosis not present

## 2021-09-08 DIAGNOSIS — K1230 Oral mucositis (ulcerative), unspecified: Secondary | ICD-10-CM | POA: Diagnosis not present

## 2021-09-08 DIAGNOSIS — B182 Chronic viral hepatitis C: Secondary | ICD-10-CM | POA: Diagnosis not present

## 2021-09-08 DIAGNOSIS — F4310 Post-traumatic stress disorder, unspecified: Secondary | ICD-10-CM | POA: Diagnosis not present

## 2021-09-08 DIAGNOSIS — K219 Gastro-esophageal reflux disease without esophagitis: Secondary | ICD-10-CM | POA: Diagnosis not present

## 2021-09-08 DIAGNOSIS — R112 Nausea with vomiting, unspecified: Secondary | ICD-10-CM | POA: Diagnosis not present

## 2021-09-08 DIAGNOSIS — G629 Polyneuropathy, unspecified: Secondary | ICD-10-CM | POA: Diagnosis not present

## 2021-09-08 DIAGNOSIS — E079 Disorder of thyroid, unspecified: Secondary | ICD-10-CM | POA: Diagnosis not present

## 2021-09-08 DIAGNOSIS — Z79899 Other long term (current) drug therapy: Secondary | ICD-10-CM | POA: Diagnosis not present

## 2021-09-08 DIAGNOSIS — Z5111 Encounter for antineoplastic chemotherapy: Secondary | ICD-10-CM | POA: Diagnosis not present

## 2021-09-08 DIAGNOSIS — R197 Diarrhea, unspecified: Secondary | ICD-10-CM | POA: Diagnosis not present

## 2021-09-08 DIAGNOSIS — R630 Anorexia: Secondary | ICD-10-CM | POA: Diagnosis not present

## 2021-09-08 DIAGNOSIS — E7800 Pure hypercholesterolemia, unspecified: Secondary | ICD-10-CM | POA: Diagnosis not present

## 2021-09-08 DIAGNOSIS — F17210 Nicotine dependence, cigarettes, uncomplicated: Secondary | ICD-10-CM | POA: Diagnosis not present

## 2021-09-08 MED ORDER — DEXTROSE 5 % IV SOLN: Freq: Once | INTRAVENOUS | Status: AC

## 2021-09-08 MED ORDER — IRINOTECAN HCL CHEMO INJECTION 100 MG/5ML
150.0000 mg/m2 | Freq: Once | INTRAVENOUS | Status: DC
  Filled 2021-09-08: qty 12

## 2021-09-08 MED ORDER — SODIUM CHLORIDE 0.9 % IV SOLN
10.0000 mg | Freq: Once | INTRAVENOUS | Status: AC
  Administered 2021-09-08: 10 mg via INTRAVENOUS
  Filled 2021-09-08: qty 10

## 2021-09-08 MED ORDER — SODIUM CHLORIDE 0.9 % IV SOLN
150.0000 mg | Freq: Once | INTRAVENOUS | Status: AC
  Administered 2021-09-08: 150 mg via INTRAVENOUS
  Filled 2021-09-08: qty 150

## 2021-09-08 MED ORDER — HYDROMORPHONE HCL 1 MG/ML IJ SOLN
1.0000 mg | Freq: Once | INTRAMUSCULAR | Status: AC
  Administered 2021-09-08: 1 mg via INTRAVENOUS
  Filled 2021-09-08: qty 1

## 2021-09-08 MED ORDER — SODIUM CHLORIDE 0.9% FLUSH
10.0000 mL | INTRAVENOUS | Status: DC | PRN
  Administered 2021-09-08: 10 mL

## 2021-09-08 MED ORDER — OXALIPLATIN CHEMO INJECTION 100 MG/20ML
85.0000 mg/m2 | Freq: Once | INTRAVENOUS | Status: AC
  Administered 2021-09-08: 135 mg via INTRAVENOUS
  Filled 2021-09-08: qty 20

## 2021-09-08 MED ORDER — HEPARIN SOD (PORK) LOCK FLUSH 100 UNIT/ML IV SOLN
500.0000 [IU] | Freq: Once | INTRAVENOUS | Status: AC | PRN
  Administered 2021-09-08: 500 [IU]

## 2021-09-08 MED ORDER — ATROPINE SULFATE 1 MG/ML IV SOLN
0.5000 mg | Freq: Once | INTRAVENOUS | Status: AC | PRN
  Administered 2021-09-08: 0.5 mg via INTRAVENOUS
  Filled 2021-09-08: qty 1

## 2021-09-08 MED ORDER — SODIUM CHLORIDE 0.9 % IV SOLN
2400.0000 mg/m2 | INTRAVENOUS | Status: DC
  Filled 2021-09-08: qty 77

## 2021-09-08 MED ORDER — PALONOSETRON HCL INJECTION 0.25 MG/5ML
0.2500 mg | Freq: Once | INTRAVENOUS | Status: AC
  Administered 2021-09-08: 0.25 mg via INTRAVENOUS
  Filled 2021-09-08: qty 5

## 2021-09-08 MED ORDER — LEUCOVORIN CALCIUM INJECTION 350 MG
400.0000 mg/m2 | Freq: Once | INTRAVENOUS | Status: AC
  Administered 2021-09-08: 640 mg via INTRAVENOUS
  Filled 2021-09-08: qty 32

## 2021-09-08 NOTE — Progress Notes (Signed)
Patient repeatedly asking who is managing his pain medication prescriptions. Patient encouraged to address this with Dr. Bobby Rumpf or PCP ? ?

## 2021-09-08 NOTE — Progress Notes (Signed)
Phone provided for patient. Patient spoke with mother- requested that his mother call Dr. Bobby Rumpf to request more pain medication ?Mother and patient notified that Dr. Bobby Rumpf is aware of pain and '1mg'$  IV Dilaudid has been given.  ? ?

## 2021-09-08 NOTE — Progress Notes (Signed)
1150 (Late entry) - Patient states he will only remain at center for the Oxaliplatin to finish- States that until he received pain management he can not tolerate treatment. Agrees to discuss this matter more when the oxaliplatin is finished ? ?

## 2021-09-08 NOTE — Progress Notes (Signed)
1330- Patient requested that 911 be called. States that he would like to go to the emergency room for pain control- Mother is in lobby- home medications and screwdriver given to mother- Mother states that she is going to leave and patient can go to ED by EMS. 911 called by patient. Patient refuses to sign AMA form. States he "doesn't want to look bad" and he wants to continue treatment just not at this time.   ?

## 2021-09-08 NOTE — Progress Notes (Signed)
Patient agrees to remain for rest of treatment ? ?

## 2021-09-08 NOTE — Patient Instructions (Signed)
Irinotecan injection What is this medication? IRINOTECAN (ir in oh TEE kan ) is a chemotherapy drug. It is used to treat colon and rectal cancer. This medicine may be used for other purposes; ask your health care provider or pharmacist if you have questions. COMMON BRAND NAME(S): Camptosar What should I tell my care team before I take this medication? They need to know if you have any of these conditions: dehydration diarrhea infection (especially a virus infection such as chickenpox, cold sores, or herpes) liver disease low blood counts, like low white cell, platelet, or red cell counts low levels of calcium, magnesium, or potassium in the blood recent or ongoing radiation therapy an unusual or allergic reaction to irinotecan, other medicines, foods, dyes, or preservatives pregnant or trying to get pregnant breast-feeding How should I use this medication? This drug is given as an infusion into a vein. It is administered in a hospital or clinic by a specially trained health care professional. Talk to your pediatrician regarding the use of this medicine in children. Special care may be needed. Overdosage: If you think you have taken too much of this medicine contact a poison control center or emergency room at once. NOTE: This medicine is only for you. Do not share this medicine with others. What if I miss a dose? It is important not to miss your dose. Call your doctor or health care professional if you are unable to keep an appointment. What may interact with this medication? Do not take this medicine with any of the following medications: cobicistat itraconazole This medicine may interact with the following medications: antiviral medicines for HIV or AIDS certain antibiotics like rifampin or rifabutin certain medicines for fungal infections like ketoconazole, posaconazole, and voriconazole certain medicines for seizures like carbamazepine, phenobarbital,  phenotoin clarithromycin gemfibrozil nefazodone [facility]. John's Wort This list may not describe all possible interactions. Give your health care provider a list of all the medicines, herbs, non-prescription drugs, or dietary supplements you use. Also tell them if you smoke, drink alcohol, or use illegal drugs. Some items may interact with your medicine. What should I watch for while using this medication? Your condition will be monitored carefully while you are receiving this medicine. You will need important blood work done while you are taking this medicine. This drug may make you feel generally unwell. This is not uncommon, as chemotherapy can affect healthy cells as well as cancer cells. Report any side effects. Continue your course of treatment even though you feel ill unless your doctor tells you to stop. In some cases, you may be given additional medicines to help with side effects. Follow all directions for their use. You may get drowsy or dizzy. Do not drive, use machinery, or do anything that needs mental alertness until you know how this medicine affects you. Do not stand or sit up quickly, especially if you are an older patient. This reduces the risk of dizzy or fainting spells. Call your health care professional for advice if you get a fever, chills, or sore throat, or other symptoms of a cold or flu. Do not treat yourself. This medicine decreases your body's ability to fight infections. Try to avoid being around people who are sick. Avoid taking products that contain aspirin, acetaminophen, ibuprofen, naproxen, or ketoprofen unless instructed by your doctor. These medicines may hide a fever. This medicine may increase your risk to bruise or bleed. Call your doctor or health care professional if you notice any unusual bleeding. Be careful brushing and  flossing your teeth or using a toothpick because you may get an infection or bleed more easily. If you have any dental work done, tell your  dentist you are receiving this medicine. Do not become pregnant while taking this medicine or for 6 months after stopping it. Women should inform their health care professional if they wish to become pregnant or think they might be pregnant. Men should not father a child while taking this medicine and for 3 months after stopping it. There is potential for serious side effects to an unborn child. Talk to your health care professional for more information. Do not breast-feed an infant while taking this medicine or for 7 days after stopping it. This medicine has caused ovarian failure in some women. This medicine may make it more difficult to get pregnant. Talk to your health care professional if you are concerned about your fertility. This medicine has caused decreased sperm counts in some men. This may make it more difficult to father a child. Talk to your health care professional if you are concerned about your fertility. What side effects may I notice from receiving this medication? Side effects that you should report to your doctor or health care professional as soon as possible: allergic reactions like skin rash, itching or hives, swelling of the face, lips, or tongue chest pain diarrhea flushing, runny nose, sweating during infusion low blood counts - this medicine may decrease the number of white blood cells, red blood cells and platelets. You may be at increased risk for infections and bleeding. nausea, vomiting pain, swelling, warmth in the leg signs of decreased platelets or bleeding - bruising, pinpoint red spots on the skin, black, tarry stools, blood in the urine signs of infection - fever or chills, cough, sore throat, pain or difficulty passing urine signs of decreased red blood cells - unusually weak or tired, fainting spells, lightheadedness Side effects that usually do not require medical attention (report to your doctor or health care professional if they continue or are  bothersome): constipation hair loss headache loss of appetite mouth sores stomach pain This list may not describe all possible side effects. Call your doctor for medical advice about side effects. You may report side effects to FDA at 1-800-FDA-1088. Where should I keep my medication? This drug is given in a hospital or clinic and will not be stored at home. NOTE: This sheet is a summary. It may not cover all possible information. If you have questions about this medicine, talk to your doctor, pharmacist, or health care provider.  2022 [employer] Standard (2021-03-09 00:00:00) Leucovorin injection What is this medication? LEUCOVORIN (loo koe VOR in) is used to prevent or treat the harmful effects of some medicines. This medicine is used to treat anemia caused by a low amount of folic acid in the body. It is also used with 5-fluorouracil (5-FU) to treat colon cancer. This medicine may be used for other purposes; ask your health care provider or pharmacist if you have questions. What should I tell my care team before I take this medication? They need to know if you have any of these conditions: anemia from low levels of vitamin B-12 in the blood an unusual or allergic reaction to leucovorin, folic acid, other medicines, foods, dyes, or preservatives pregnant or trying to get pregnant breast-feeding How should I use this medication? This medicine is for injection into a muscle or into a vein. It is given by a health care professional in a hospital or clinic setting.  Talk to your pediatrician regarding the use of this medicine in children. Special care may be needed. Overdosage: If you think you have taken too much of this medicine contact a poison control center or emergency room at once. NOTE: This medicine is only for you. Do not share this medicine with others. What if I miss a dose? This does not apply. What may interact with this  medication? capecitabine fluorouracil phenobarbital phenytoin primidone trimethoprim-sulfamethoxazole This list may not describe all possible interactions. Give your health care provider a list of all the medicines, herbs, non-prescription drugs, or dietary supplements you use. Also tell them if you smoke, drink alcohol, or use illegal drugs. Some items may interact with your medicine. What should I watch for while using this medication? Your condition will be monitored carefully while you are receiving this medicine. This medicine may increase the side effects of 5-fluorouracil, 5-FU. Tell your doctor or health care professional if you have diarrhea or mouth sores that do not get better or that get worse. What side effects may I notice from receiving this medication? Side effects that you should report to your doctor or health care professional as soon as possible: allergic reactions like skin rash, itching or hives, swelling of the face, lips, or tongue breathing problems fever, infection mouth sores unusual bleeding or bruising unusually weak or tired Side effects that usually do not require medical attention (report to your doctor or health care professional if they continue or are bothersome): constipation or diarrhea loss of appetite nausea, vomiting This list may not describe all possible side effects. Call your doctor for medical advice about side effects. You may report side effects to FDA at 1-800-FDA-1088. Where should I keep my medication? This drug is given in a hospital or clinic and will not be stored at home. NOTE: This sheet is a summary. It may not cover all possible information. If you have questions about this medicine, talk to your doctor, pharmacist, or health care provider.  2022 [employer] Standard (2007-12-27 00:00:00) Oxaliplatin Injection What is this medication? OXALIPLATIN (ox AL i PLA tin) is a chemotherapy drug. It targets fast dividing cells, like  cancer cells, and causes these cells to die. This medicine is used to treat cancers of the colon and rectum, and many other cancers. This medicine may be used for other purposes; ask your health care provider or pharmacist if you have questions. COMMON BRAND NAME(S): Eloxatin What should I tell my care team before I take this medication? They need to know if you have any of these conditions: heart disease history of irregular heartbeat liver disease low blood counts, like white cells, platelets, or red blood cells lung or breathing disease, like asthma take medicines that treat or prevent blood clots tingling of the fingers or toes, or other nerve disorder an unusual or allergic reaction to oxaliplatin, other chemotherapy, other medicines, foods, dyes, or preservatives pregnant or trying to get pregnant breast-feeding How should I use this medication? This drug is given as an infusion into a vein. It is administered in a hospital or clinic by a specially trained health care professional. Talk to your pediatrician regarding the use of this medicine in children. Special care may be needed. Overdosage: If you think you have taken too much of this medicine contact a poison control center or emergency room at once. NOTE: This medicine is only for you. Do not share this medicine with others. What if I miss a dose? It is important not to  miss a dose. Call your doctor or health care professional if you are unable to keep an appointment. What may interact with this medication? Do not take this medicine with any of the following medications: cisapride dronedarone pimozide thioridazine This medicine may also interact with the following medications: aspirin and aspirin-like medicines certain medicines that treat or prevent blood clots like warfarin, apixaban, dabigatran, and rivaroxaban cisplatin cyclosporine diuretics medicines for infection like acyclovir, adefovir, amphotericin B,  bacitracin, cidofovir, foscarnet, ganciclovir, gentamicin, pentamidine, vancomycin NSAIDs, medicines for pain and inflammation, like ibuprofen or naproxen other medicines that prolong the QT interval (an abnormal heart rhythm) pamidronate zoledronic acid This list may not describe all possible interactions. Give your health care provider a list of all the medicines, herbs, non-prescription drugs, or dietary supplements you use. Also tell them if you smoke, drink alcohol, or use illegal drugs. Some items may interact with your medicine. What should I watch for while using this medication? Your condition will be monitored carefully while you are receiving this medicine. You may need blood work done while you are taking this medicine. This medicine may make you feel generally unwell. This is not uncommon as chemotherapy can affect healthy cells as well as cancer cells. Report any side effects. Continue your course of treatment even though you feel ill unless your healthcare professional tells you to stop. This medicine can make you more sensitive to cold. Do not drink cold drinks or use ice. Cover exposed skin before coming in contact with cold temperatures or cold objects. When out in cold weather wear warm clothing and cover your mouth and nose to warm the air that goes into your lungs. Tell your doctor if you get sensitive to the cold. Do not become pregnant while taking this medicine or for 9 months after stopping it. Women should inform their health care professional if they wish to become pregnant or think they might be pregnant. Men should not father a child while taking this medicine and for 6 months after stopping it. There is potential for serious side effects to an unborn child. Talk to your health care professional for more information. Do not breast-feed a child while taking this medicine or for 3 months after stopping it. This medicine has caused ovarian failure in some women. This medicine  may make it more difficult to get pregnant. Talk to your health care professional if you are concerned about your fertility. This medicine has caused decreased sperm counts in some men. This may make it more difficult to father a child. Talk to your health care professional if you are concerned about your fertility. This medicine may increase your risk of getting an infection. Call your health care professional for advice if you get a fever, chills, or sore throat, or other symptoms of a cold or flu. Do not treat yourself. Try to avoid being around people who are sick. Avoid taking medicines that contain aspirin, acetaminophen, ibuprofen, naproxen, or ketoprofen unless instructed by your health care professional. These medicines may hide a fever. Be careful brushing or flossing your teeth or using a toothpick because you may get an infection or bleed more easily. If you have any dental work done, tell your dentist you are receiving this medicine. What side effects may I notice from receiving this medication? Side effects that you should report to your doctor or health care professional as soon as possible: allergic reactions like skin rash, itching or hives, swelling of the face, lips, or tongue breathing problems  cough low blood counts - this medicine may decrease the number of white blood cells, red blood cells, and platelets. You may be at increased risk for infections and bleeding nausea, vomiting pain, redness, or irritation at site where injected pain, tingling, numbness in the hands or feet signs and symptoms of bleeding such as bloody or black, tarry stools; red or dark brown urine; spitting up blood or brown material that looks like coffee grounds; red spots on the skin; unusual bruising or bleeding from the eyes, gums, or nose signs and symptoms of a dangerous change in heartbeat or heart rhythm like chest pain; dizziness; fast, irregular heartbeat; palpitations; feeling faint or  lightheaded; falls signs and symptoms of infection like fever; chills; cough; sore throat; pain or trouble passing urine signs and symptoms of liver injury like dark yellow or brown urine; general ill feeling or flu-like symptoms; light-colored stools; loss of appetite; nausea; right upper belly pain; unusually weak or tired; yellowing of the eyes or skin signs and symptoms of low red blood cells or anemia such as unusually weak or tired; feeling faint or lightheaded; falls signs and symptoms of muscle injury like dark urine; trouble passing urine or change in the amount of urine; unusually weak or tired; muscle pain; back pain Side effects that usually do not require medical attention (report to your doctor or health care professional if they continue or are bothersome): changes in taste diarrhea gas hair loss loss of appetite mouth sores This list may not describe all possible side effects. Call your doctor for medical advice about side effects. You may report side effects to FDA at 1-800-FDA-1088. Where should I keep my medication? This drug is given in a hospital or clinic and will not be stored at home. NOTE: This sheet is a summary. It may not cover all possible information. If you have questions about this medicine, talk to your doctor, pharmacist, or health care provider.  2022 [employer] Standard (2021-03-09 00:00:00)

## 2021-09-08 NOTE — Progress Notes (Signed)
Patient insists on having a "cold" sprite- Instructed patient that this could not be provided due to Oxaliplatin. Patient states understanding. Patient called mother to ask for ride home- states he will only receive treatment until she arrive. Leucovorin continues ? ?

## 2021-09-08 NOTE — Progress Notes (Signed)
1020- Patient states that he will sign out if he is unable to receive pain medication- Dr. Bobby Rumpf notified and order received. Instructed patient that if he feels he must leave treatment, nurse will stop medications and flush his port. Patient agrees to remain as long as he feels it is possible.  ?

## 2021-09-08 NOTE — Progress Notes (Signed)
1400- EMS here to take patient to emergency dept. Report provided.  ?

## 2021-09-09 ENCOUNTER — Other Ambulatory Visit: Payer: Self-pay | Admitting: Hematology and Oncology

## 2021-09-09 ENCOUNTER — Telehealth: Payer: Self-pay

## 2021-09-09 MED ORDER — OXYCODONE HCL 20 MG PO TABS: 20.0000 mg | ORAL_TABLET | ORAL | 0 refills | Status: DC | PRN

## 2021-09-09 NOTE — Telephone Encounter (Signed)
Spoke with patient's mother Juliann Pulse) and advised per Dr. Bobby Rumpf we will send referral to IR for cardiac plexus nerve block to help with pain control. ?

## 2021-09-10 ENCOUNTER — Encounter: Payer: Self-pay | Admitting: Oncology

## 2021-09-10 ENCOUNTER — Telehealth: Payer: Self-pay

## 2021-09-10 NOTE — Telephone Encounter (Signed)
I spoke with pt's mom, Juliann Pulse, ad Elberta Fortis was in background. Pt have his first chemo infusion on 09/08/2021. He denies N/V, diarrhea, rashes, cold intolerance, and fevers. I reminded them to call if he develops temp of 100.4 or higher, day or night. She verbalized understanding. I confirmed next appts w/them. I also reminded them that he should only take his pain medication as prescribed (oxycodone '20mg'$  1 tab po q 4hr PRN pain; along with the 2 fentanyl patches 30mg & 171m). They both verbalized, "I know". ?

## 2021-09-13 ENCOUNTER — Ambulatory Visit: Payer: Medicare Other | Admitting: Oncology

## 2021-09-15 ENCOUNTER — Other Ambulatory Visit: Payer: Self-pay

## 2021-09-15 ENCOUNTER — Encounter (HOSPITAL_COMMUNITY): Payer: Self-pay | Admitting: Emergency Medicine

## 2021-09-15 ENCOUNTER — Telehealth: Payer: Self-pay

## 2021-09-15 ENCOUNTER — Inpatient Hospital Stay (HOSPITAL_COMMUNITY)
Admission: EM | Admit: 2021-09-15 | Discharge: 2021-09-18 | DRG: 948 | Disposition: A | Discharge status: Home / Self Care | Payer: Medicare Other | Attending: Family Medicine | Admitting: Family Medicine

## 2021-09-15 DIAGNOSIS — R45850 Homicidal ideations: Secondary | ICD-10-CM | POA: Diagnosis present

## 2021-09-15 DIAGNOSIS — F17210 Nicotine dependence, cigarettes, uncomplicated: Secondary | ICD-10-CM | POA: Diagnosis present

## 2021-09-15 DIAGNOSIS — F419 Anxiety disorder, unspecified: Secondary | ICD-10-CM | POA: Diagnosis present

## 2021-09-15 DIAGNOSIS — G893 Neoplasm related pain (acute) (chronic): Principal | ICD-10-CM | POA: Diagnosis present

## 2021-09-15 DIAGNOSIS — Z79899 Other long term (current) drug therapy: Secondary | ICD-10-CM

## 2021-09-15 DIAGNOSIS — Z20822 Contact with and (suspected) exposure to covid-19: Secondary | ICD-10-CM | POA: Diagnosis present

## 2021-09-15 DIAGNOSIS — Z7151 Drug abuse counseling and surveillance of drug abuser: Secondary | ICD-10-CM

## 2021-09-15 DIAGNOSIS — F4310 Post-traumatic stress disorder, unspecified: Secondary | ICD-10-CM | POA: Diagnosis present

## 2021-09-15 DIAGNOSIS — C251 Malignant neoplasm of body of pancreas: Secondary | ICD-10-CM | POA: Diagnosis present

## 2021-09-15 DIAGNOSIS — F319 Bipolar disorder, unspecified: Secondary | ICD-10-CM | POA: Diagnosis present

## 2021-09-15 DIAGNOSIS — Z888 Allergy status to other drugs, medicaments and biological substances status: Secondary | ICD-10-CM

## 2021-09-15 DIAGNOSIS — Z765 Malingerer [conscious simulation]: Secondary | ICD-10-CM

## 2021-09-15 DIAGNOSIS — Z885 Allergy status to narcotic agent status: Secondary | ICD-10-CM

## 2021-09-15 DIAGNOSIS — F1210 Cannabis abuse, uncomplicated: Secondary | ICD-10-CM | POA: Diagnosis present

## 2021-09-15 DIAGNOSIS — F39 Unspecified mood [affective] disorder: Secondary | ICD-10-CM | POA: Diagnosis present

## 2021-09-15 DIAGNOSIS — F79 Unspecified intellectual disabilities: Secondary | ICD-10-CM | POA: Diagnosis present

## 2021-09-15 DIAGNOSIS — E7800 Pure hypercholesterolemia, unspecified: Secondary | ICD-10-CM | POA: Diagnosis present

## 2021-09-15 DIAGNOSIS — Z9151 Personal history of suicidal behavior: Secondary | ICD-10-CM

## 2021-09-15 DIAGNOSIS — Z9221 Personal history of antineoplastic chemotherapy: Secondary | ICD-10-CM

## 2021-09-15 DIAGNOSIS — C259 Malignant neoplasm of pancreas, unspecified: Secondary | ICD-10-CM | POA: Diagnosis present

## 2021-09-15 DIAGNOSIS — R109 Unspecified abdominal pain: Secondary | ICD-10-CM | POA: Diagnosis present

## 2021-09-15 DIAGNOSIS — B182 Chronic viral hepatitis C: Secondary | ICD-10-CM | POA: Diagnosis present

## 2021-09-15 DIAGNOSIS — Z550 Illiteracy and low-level literacy: Secondary | ICD-10-CM

## 2021-09-15 DIAGNOSIS — Z79891 Long term (current) use of opiate analgesic: Secondary | ICD-10-CM

## 2021-09-15 DIAGNOSIS — K219 Gastro-esophageal reflux disease without esophagitis: Secondary | ICD-10-CM | POA: Diagnosis present

## 2021-09-15 DIAGNOSIS — R52 Pain, unspecified: Principal | ICD-10-CM

## 2021-09-15 MED ORDER — HYDROMORPHONE HCL 1 MG/ML IJ SOLN
1.0000 mg | Freq: Once | INTRAMUSCULAR | Status: AC
  Administered 2021-09-15: 1 mg via INTRAVENOUS
  Filled 2021-09-15: qty 1

## 2021-09-15 MED ORDER — SODIUM CHLORIDE 0.9 % IV BOLUS
1000.0000 mL | Freq: Once | INTRAVENOUS | Status: AC
  Administered 2021-09-15: 1000 mL via INTRAVENOUS

## 2021-09-15 MED ORDER — ONDANSETRON HCL 4 MG/2ML IJ SOLN
4.0000 mg | Freq: Once | INTRAMUSCULAR | Status: AC
  Administered 2021-09-15: 4 mg via INTRAVENOUS
  Filled 2021-09-15: qty 2

## 2021-09-15 NOTE — ED Triage Notes (Addendum)
Patient arrived with EMS from home reports chronic LUQ abdominal pain worse these past several weeks , occasional emesis , no fever or chills . Currently receiving chemotherapy for pancreatic cancer . Patient received Fentanyl 100 mcg IV by EMS with mild relief.  ?

## 2021-09-15 NOTE — Telephone Encounter (Signed)
Attempted to return call to patient's mother Charleston Poot) [phone] to advise of appt with IR for consult for Celiac Plexus block.  Patient is scheduled for Friday, 09-17-2021 @ 2:30pm.  He must arrive for this consult in order to be scheduled next week with the surgeon.  I got voicemail with no mailbox set up.   ?

## 2021-09-16 ENCOUNTER — Other Ambulatory Visit: Payer: Self-pay

## 2021-09-16 ENCOUNTER — Emergency Department (HOSPITAL_COMMUNITY): Payer: Medicare Other

## 2021-09-16 ENCOUNTER — Encounter: Payer: Self-pay | Admitting: Oncology

## 2021-09-16 ENCOUNTER — Encounter (HOSPITAL_COMMUNITY): Payer: Self-pay | Admitting: Internal Medicine

## 2021-09-16 DIAGNOSIS — Z20822 Contact with and (suspected) exposure to covid-19: Secondary | ICD-10-CM | POA: Diagnosis present

## 2021-09-16 DIAGNOSIS — R45850 Homicidal ideations: Secondary | ICD-10-CM | POA: Diagnosis present

## 2021-09-16 DIAGNOSIS — C259 Malignant neoplasm of pancreas, unspecified: Secondary | ICD-10-CM

## 2021-09-16 DIAGNOSIS — F39 Unspecified mood [affective] disorder: Secondary | ICD-10-CM | POA: Diagnosis not present

## 2021-09-16 DIAGNOSIS — R45851 Suicidal ideations: Secondary | ICD-10-CM | POA: Diagnosis not present

## 2021-09-16 DIAGNOSIS — E7800 Pure hypercholesterolemia, unspecified: Secondary | ICD-10-CM | POA: Diagnosis present

## 2021-09-16 DIAGNOSIS — Z765 Malingerer [conscious simulation]: Secondary | ICD-10-CM | POA: Diagnosis not present

## 2021-09-16 DIAGNOSIS — B182 Chronic viral hepatitis C: Secondary | ICD-10-CM | POA: Diagnosis present

## 2021-09-16 DIAGNOSIS — Z550 Illiteracy and low-level literacy: Secondary | ICD-10-CM | POA: Diagnosis not present

## 2021-09-16 DIAGNOSIS — F4310 Post-traumatic stress disorder, unspecified: Secondary | ICD-10-CM | POA: Diagnosis present

## 2021-09-16 DIAGNOSIS — G893 Neoplasm related pain (acute) (chronic): Secondary | ICD-10-CM | POA: Diagnosis present

## 2021-09-16 DIAGNOSIS — F1210 Cannabis abuse, uncomplicated: Secondary | ICD-10-CM | POA: Diagnosis present

## 2021-09-16 DIAGNOSIS — F79 Unspecified intellectual disabilities: Secondary | ICD-10-CM | POA: Diagnosis present

## 2021-09-16 DIAGNOSIS — Z9151 Personal history of suicidal behavior: Secondary | ICD-10-CM | POA: Diagnosis not present

## 2021-09-16 DIAGNOSIS — Z79891 Long term (current) use of opiate analgesic: Secondary | ICD-10-CM | POA: Diagnosis not present

## 2021-09-16 DIAGNOSIS — F17210 Nicotine dependence, cigarettes, uncomplicated: Secondary | ICD-10-CM | POA: Diagnosis present

## 2021-09-16 DIAGNOSIS — Z885 Allergy status to narcotic agent status: Secondary | ICD-10-CM | POA: Diagnosis not present

## 2021-09-16 DIAGNOSIS — Z7151 Drug abuse counseling and surveillance of drug abuser: Secondary | ICD-10-CM | POA: Diagnosis not present

## 2021-09-16 DIAGNOSIS — K219 Gastro-esophageal reflux disease without esophagitis: Secondary | ICD-10-CM | POA: Diagnosis present

## 2021-09-16 DIAGNOSIS — R109 Unspecified abdominal pain: Secondary | ICD-10-CM

## 2021-09-16 DIAGNOSIS — C251 Malignant neoplasm of body of pancreas: Secondary | ICD-10-CM | POA: Diagnosis present

## 2021-09-16 DIAGNOSIS — Z9221 Personal history of antineoplastic chemotherapy: Secondary | ICD-10-CM | POA: Diagnosis not present

## 2021-09-16 DIAGNOSIS — F419 Anxiety disorder, unspecified: Secondary | ICD-10-CM | POA: Diagnosis present

## 2021-09-16 DIAGNOSIS — Z888 Allergy status to other drugs, medicaments and biological substances status: Secondary | ICD-10-CM | POA: Diagnosis not present

## 2021-09-16 DIAGNOSIS — F319 Bipolar disorder, unspecified: Secondary | ICD-10-CM | POA: Diagnosis present

## 2021-09-16 DIAGNOSIS — Z79899 Other long term (current) drug therapy: Secondary | ICD-10-CM | POA: Diagnosis not present

## 2021-09-16 LAB — CBC WITH DIFFERENTIAL/PLATELET
Abs Immature Granulocytes: 0.01 10*3/uL (ref 0.00–0.07)
Basophils Absolute: 0 10*3/uL (ref 0.0–0.1)
Basophils Relative: 0 %
Eosinophils Absolute: 0.1 10*3/uL (ref 0.0–0.5)
Eosinophils Relative: 2 %
HCT: 39.2 % (ref 39.0–52.0)
Hemoglobin: 13.1 g/dL (ref 13.0–17.0)
Immature Granulocytes: 0 %
Lymphocytes Relative: 38 %
Lymphs Abs: 2.1 10*3/uL (ref 0.7–4.0)
MCH: 31.2 pg (ref 26.0–34.0)
MCHC: 33.4 g/dL (ref 30.0–36.0)
MCV: 93.3 fL (ref 80.0–100.0)
Monocytes Absolute: 0.5 10*3/uL (ref 0.1–1.0)
Monocytes Relative: 9 %
Neutro Abs: 2.8 10*3/uL (ref 1.7–7.7)
Neutrophils Relative %: 51 %
Platelets: 191 10*3/uL (ref 150–400)
RBC: 4.2 MIL/uL — ABNORMAL LOW (ref 4.22–5.81)
RDW: 12.2 % (ref 11.5–15.5)
WBC: 5.4 10*3/uL (ref 4.0–10.5)
nRBC: 0 % (ref 0.0–0.2)

## 2021-09-16 LAB — COMPREHENSIVE METABOLIC PANEL
ALT: 14 U/L (ref 0–44)
AST: 16 U/L (ref 15–41)
Albumin: 4 g/dL (ref 3.5–5.0)
Alkaline Phosphatase: 60 U/L (ref 38–126)
Anion gap: 13 (ref 5–15)
BUN: 15 mg/dL (ref 6–20)
CO2: 25 mmol/L (ref 22–32)
Calcium: 8.6 mg/dL — ABNORMAL LOW (ref 8.9–10.3)
Chloride: 99 mmol/L (ref 98–111)
Creatinine, Ser: 0.74 mg/dL (ref 0.61–1.24)
GFR, Estimated: >60 mL/min (ref >60)
Glucose, Bld: 110 mg/dL — ABNORMAL HIGH (ref 70–99)
Potassium: 3.6 mmol/L (ref 3.5–5.1)
Sodium: 137 mmol/L (ref 135–145)
Total Bilirubin: 0.7 mg/dL (ref 0.3–1.2)
Total Protein: 6.3 g/dL — ABNORMAL LOW (ref 6.5–8.1)

## 2021-09-16 LAB — RESP PANEL BY RT-PCR (FLU A&B, COVID) ARPGX2
Influenza A by PCR: NEGATIVE
Influenza B by PCR: NEGATIVE
SARS Coronavirus 2 by RT PCR: NEGATIVE

## 2021-09-16 LAB — LIPASE, BLOOD: Lipase: 25 U/L (ref 11–51)

## 2021-09-16 LAB — LACTIC ACID, PLASMA: Lactic Acid, Venous: 1.2 mmol/L (ref 0.5–1.9)

## 2021-09-16 MED ORDER — KETOROLAC TROMETHAMINE 30 MG/ML IJ SOLN
30.0000 mg | Freq: Four times a day (QID) | INTRAMUSCULAR | Status: DC
  Administered 2021-09-17 – 2021-09-18 (×3): 30 mg via INTRAVENOUS
  Filled 2021-09-16 (×5): qty 1

## 2021-09-16 MED ORDER — ONDANSETRON HCL 4 MG PO TABS: 4.0000 mg | ORAL_TABLET | Freq: Four times a day (QID) | ORAL | Status: DC | PRN

## 2021-09-16 MED ORDER — ONDANSETRON HCL 4 MG/2ML IJ SOLN
4.0000 mg | Freq: Once | INTRAMUSCULAR | Status: AC
Start: 2021-09-16 — End: 2021-09-16
  Administered 2021-09-16: 4 mg via INTRAVENOUS
  Filled 2021-09-16: qty 2

## 2021-09-16 MED ORDER — NALOXONE HCL 0.4 MG/ML IJ SOLN: 0.4000 mg | INTRAMUSCULAR | Status: DC | PRN

## 2021-09-16 MED ORDER — ACETAMINOPHEN 650 MG RE SUPP: 650.0000 mg | Freq: Four times a day (QID) | RECTAL | Status: DC | PRN

## 2021-09-16 MED ORDER — ACETAMINOPHEN 325 MG PO TABS
650.0000 mg | ORAL_TABLET | Freq: Four times a day (QID) | ORAL | Status: DC
  Administered 2021-09-16 – 2021-09-18 (×5): 650 mg via ORAL
  Filled 2021-09-16 (×5): qty 2

## 2021-09-16 MED ORDER — IOHEXOL 350 MG/ML SOLN
80.0000 mL | Freq: Once | INTRAVENOUS | Status: AC | PRN
  Administered 2021-09-16: 80 mL via INTRAVENOUS

## 2021-09-16 MED ORDER — HYDROMORPHONE HCL 1 MG/ML IJ SOLN
0.5000 mg | INTRAMUSCULAR | Status: DC | PRN
  Administered 2021-09-16 (×2): 0.5 mg via INTRAVENOUS
  Filled 2021-09-16 (×2): qty 1

## 2021-09-16 MED ORDER — HYDROMORPHONE HCL 1 MG/ML IJ SOLN
2.0000 mg | Freq: Once | INTRAMUSCULAR | Status: AC
  Administered 2021-09-16: 2 mg via INTRAVENOUS
  Filled 2021-09-16: qty 2

## 2021-09-16 MED ORDER — DIPHENHYDRAMINE HCL 12.5 MG/5ML PO ELIX: 12.5000 mg | ORAL_SOLUTION | Freq: Four times a day (QID) | ORAL | Status: DC | PRN

## 2021-09-16 MED ORDER — DIPHENHYDRAMINE HCL 50 MG/ML IJ SOLN: 12.5000 mg | Freq: Four times a day (QID) | INTRAMUSCULAR | Status: DC | PRN

## 2021-09-16 MED ORDER — ONDANSETRON HCL 4 MG/2ML IJ SOLN: 4.0000 mg | Freq: Four times a day (QID) | INTRAMUSCULAR | Status: DC | PRN

## 2021-09-16 MED ORDER — PANTOPRAZOLE SODIUM 40 MG PO TBEC
40.0000 mg | DELAYED_RELEASE_TABLET | Freq: Every day | ORAL | Status: DC
Start: 2021-09-16 — End: 2021-09-18
  Administered 2021-09-16 – 2021-09-18 (×2): 40 mg via ORAL
  Filled 2021-09-16 (×2): qty 1

## 2021-09-16 MED ORDER — HYDROMORPHONE HCL 1 MG/ML IJ SOLN
1.0000 mg | Freq: Once | INTRAMUSCULAR | Status: AC
Start: 2021-09-16 — End: 2021-09-16
  Administered 2021-09-16: 1 mg via INTRAVENOUS
  Filled 2021-09-16: qty 1

## 2021-09-16 MED ORDER — LORAZEPAM 2 MG/ML IJ SOLN
1.0000 mg | INTRAMUSCULAR | Status: DC | PRN
  Administered 2021-09-16 – 2021-09-17 (×4): 1 mg via INTRAVENOUS
  Filled 2021-09-16 (×5): qty 1

## 2021-09-16 MED ORDER — HYDROMORPHONE 1 MG/ML IV SOLN: INTRAVENOUS | Status: DC

## 2021-09-16 MED ORDER — ACETAMINOPHEN 325 MG PO TABS: 650.0000 mg | ORAL_TABLET | Freq: Four times a day (QID) | ORAL | Status: DC | PRN

## 2021-09-16 MED ORDER — LACTATED RINGERS IV SOLN: INTRAVENOUS | Status: DC

## 2021-09-16 MED ORDER — HYDROMORPHONE HCL 1 MG/ML IJ SOLN
1.0000 mg | Freq: Once | INTRAMUSCULAR | Status: AC
  Administered 2021-09-16: 1 mg via INTRAVENOUS
  Filled 2021-09-16: qty 1

## 2021-09-16 MED ORDER — AMLODIPINE BESYLATE 5 MG PO TABS
5.0000 mg | ORAL_TABLET | Freq: Once | ORAL | Status: AC
  Administered 2021-09-16: 5 mg via ORAL
  Filled 2021-09-16: qty 1

## 2021-09-16 MED ORDER — HYDRALAZINE HCL 20 MG/ML IJ SOLN: 5.0000 mg | INTRAMUSCULAR | Status: DC | PRN

## 2021-09-16 MED ORDER — QUETIAPINE FUMARATE 25 MG PO TABS
25.0000 mg | ORAL_TABLET | Freq: Three times a day (TID) | ORAL | Status: DC | PRN
Start: 2021-09-16 — End: 2021-09-17
  Administered 2021-09-17: 25 mg via ORAL
  Filled 2021-09-16: qty 1

## 2021-09-16 MED ORDER — NICOTINE 14 MG/24HR TD PT24
14.0000 mg | MEDICATED_PATCH | Freq: Every day | TRANSDERMAL | Status: DC
  Filled 2021-09-16 (×3): qty 1

## 2021-09-16 MED ORDER — SODIUM CHLORIDE 0.9% FLUSH: 9.0000 mL | INTRAVENOUS | Status: DC | PRN

## 2021-09-16 MED ORDER — HYDROMORPHONE HCL 1 MG/ML IJ SOLN
2.0000 mg | INTRAMUSCULAR | Status: DC | PRN
  Administered 2021-09-16 – 2021-09-18 (×6): 2 mg via INTRAVENOUS
  Filled 2021-09-16 (×6): qty 2

## 2021-09-16 NOTE — ED Notes (Signed)
Pt states the pain medication didn't help at all.  ?

## 2021-09-16 NOTE — ED Notes (Signed)
Pt in bed with eyes closed, pt arouses to verbal stim, pt reports 8/10 abd pain, pt requests a coke, pt removed the iv from his L arm. Dressing placed  ?

## 2021-09-16 NOTE — Assessment & Plan Note (Signed)
-  He clearly had significant anxiety and agitation at the time of my evaluation - maybe benefitting from atypical antipsychotic? ?-Based on his obvious underlying mood disorder, psychiatry was consulted to assist with medication management ?-He was given Ativan for now, although Dr. Lovette Cliche does not want the patient to receive outpatient BZD ?-Psychiatry attempted to see him and he refused to talk to them on multiple attempts because he wanted to eat first ?-He did endorse homicidal ideation about the person who did his pancreas biopsy as well as SI, but he was not actively with SI/HI and so IVC was not performed by psych ?-He did have a safety sitter ordered ?

## 2021-09-16 NOTE — Assessment & Plan Note (Signed)
-  Cessation encouraged; this should be encouraged on an ongoing basis ?-UDS ordered ?

## 2021-09-16 NOTE — Consult Note (Signed)
? ?Chief Complaint: ?Patient was seen in consultation today for abdominal pain, pancreatic cancer ? ?Referring Physician(s): ?Dr. Delora Fuel ? ?Supervising Physician: Corrie Mckusick ? ?Patient Status: [facility] - In-pt ? ?History of Present Illness: ?Tony Elliott is a 41 y.o. male with medical history including hep C, anxiety, bipolar, pancreatic cancer receiving active chemotherapy presents with complaints of epigastric/left upper quadrant tenderness.  Patient states has been having this pain since he had EUS biopsy in February.  He states the pain is constant and radiates into his back. He does have pain medication at home however it is not sufficiently managing his pain.  IR consulted for celiac plexus block.  ? ?Of note, IR was recently consulted for same 08/24/21, however patient refused at that time due to fear/anxiety over making pain worse. PA to bedside with Dr. Earleen Newport who has discussed procedure in detail.  Discussed risks and benefits. He now states his pain is unmanageable and he is willing to proceed.  ? ? ?Past Medical History:  ?Diagnosis Date  ? Anxiety   ? Bipolar disorder ([place])   ? Cancer [facility])   ? Chronic hepatitis C ([place])   ? Depression   ? GERD (gastroesophageal reflux disease)   ? H. pylori infection   ? History of respiratory failure   ? Hypercholesterolemia   ? Intentional drug overdose ([place])   ? with multiple suicidal gestures in the past  ? PTSD (post-traumatic stress disorder)   ? Thyroid disease   ? ? ?Past Surgical History:  ?Procedure Laterality Date  ? ESOPHAGOGASTRODUODENOSCOPY  11/19/2010  ? Normal EGD  ? ESOPHAGOGASTRODUODENOSCOPY (EGD) WITH PROPOFOL N/A 08/19/2021  ? Procedure: ESOPHAGOGASTRODUODENOSCOPY (EGD) WITH PROPOFOL;  Surgeon: Milus Banister, MD;  Location: [facility];  Service: Endoscopy;  Laterality: N/A;  ? EUS N/A 08/19/2021  ? Procedure: UPPER ENDOSCOPIC ULTRASOUND (EUS) RADIAL;  Surgeon: Milus Banister, MD;  Location: [facility];  Service: Endoscopy;   Laterality: N/A;  ? FINE NEEDLE ASPIRATION N/A 08/19/2021  ? Procedure: FINE NEEDLE ASPIRATION (FNA) LINEAR;  Surgeon: Milus Banister, MD;  Location: [facility];  Service: Endoscopy;  Laterality: N/A;  ? ? ?Allergies: ?Ketorolac tromethamine, Ultram [tramadol hcl], Ketorolac tromethamine, and Tramadol ? ?Medications: ?Prior to Admission medications   ?Medication Sig Start Date End Date Taking? Authorizing Provider  ?ibuprofen (ADVIL) 200 MG tablet Take 200 mg by mouth every 6 (six) hours as needed for headache or moderate pain.   Yes [provider]  ?loperamide (IMODIUM) 2 MG capsule Take 2 at onset of diarrhea, then 1 every 2hrs until 12hr without a BM. May take 2 tab every 4hrs at bedtime. If diarrhea recurs repeat. ?Patient taking differently: Take 2-4 mg by mouth See admin instructions. Take 2 at onset of diarrhea, then 1 every 2hrs until 12hr without a BM. May take 2 tab every 4hrs at bedtime. If diarrhea recurs repeat. 09/03/21  Yes Lewis, Dequincy A, MD  ?naloxone [facility]) nasal spray 4 mg/0.1 mL Place 1 spray into the nose as needed (opoid overdose). 06/29/21  Yes [provider]  ?ondansetron (ZOFRAN) 8 MG tablet Take 1 tablet (8 mg total) by mouth 2 (two) times daily as needed. Start on day 3 after chemotherapy. ?Patient taking differently: Take 8 mg by mouth 2 (two) times daily as needed for vomiting or nausea. Start on day 3 after chemotherapy. 09/03/21  Yes Lewis, Dequincy A, MD  ?Oxycodone HCl 20 MG TABS Take 1 tablet (20 mg total) by mouth every 4 (four)  hours as needed. ?Patient taking differently: Take 20 mg by mouth every 4 (four) hours as needed (pain). 09/09/21  Yes Dayton Scrape A, NP  ?pantoprazole (PROTONIX) 40 MG tablet Take 1 tablet (40 mg total) by mouth daily. 05/26/21  Yes Jackquline Denmark, MD  ?prochlorperazine (COMPAZINE) 10 MG tablet Take 1 tablet (10 mg total) by mouth every 6 (six) hours as needed for nausea or vomiting. 09/01/21  Yes [place] Scrape A, NP   ?dexamethasone (DECADRON) 4 MG tablet Take 2 tablets (8 mg total) by mouth daily. Start the day after chemotherapy for 3 days. Take with food. 09/03/21   Lewis, Dequincy A, MD  ?fentaNYL (DURAGESIC) 12 MCG/HR Place 1 patch onto the skin every 3 (three) days. ?Patient not taking: Reported on 09/16/2021 09/03/21   Dayton Scrape A, NP  ?ondansetron (ZOFRAN) 4 MG tablet Take 1 tablet (4 mg total) by mouth every 4 (four) hours as needed for nausea. ?Patient not taking: Reported on 09/16/2021 09/01/21   Melodye Ped, NP  ?  ? ?Family History  ?Problem Relation Age of Onset  ? Heart disease Mother   ? High blood pressure Mother   ? Diabetes Mother   ? Colon cancer Neg Hx   ? Pancreatic cancer Neg Hx   ? Rectal cancer Neg Hx   ? Stomach cancer Neg Hx   ? ? ?Social History  ? ?Socioeconomic History  ? Marital status: Single  ?  Spouse name: Not on file  ? Number of children: Not on file  ? Years of education: Not on file  ? Highest education level: Not on file  ?Occupational History  ? Occupation: disabled  ?Tobacco Use  ? Smoking status: Every Day  ?  Packs/day: 0.50  ?  Types: Cigarettes  ? Smokeless tobacco: Never  ?Vaping Use  ? Vaping Use: Never used  ?Substance and Sexual Activity  ? Alcohol use: Not Currently  ?  Comment: heavy alcohol use, quit in 2018  ? Drug use: Yes  ?  Types: Marijuana  ?  Comment: previously on 40 oxy, xanax TID, and adderall BID; still with daily use of THC  ? Sexual activity: Not on file  ?Other Topics Concern  ? Not on file  ?Social History Narrative  ? ** Merged History Encounter **  ?    ? ?Social Determinants of Health  ? ?Financial Resource Strain: Not on file  ?Food Insecurity: Not on file  ?Transportation Needs: Not on file  ?Physical Activity: Not on file  ?Stress: Not on file  ?Social Connections: Not on file  ? ? ? ?Review of Systems: A 12 point ROS discussed and pertinent positives are indicated in the HPI above.  All other systems are negative. ? ?Review of Systems   ?Constitutional:  Negative for fatigue and fever.  ?Respiratory:  Negative for cough and shortness of breath.   ?Cardiovascular:  Negative for chest pain.  ?Gastrointestinal:  Positive for abdominal pain.  ?Genitourinary:  Negative for difficulty urinating.  ?Musculoskeletal:  Positive for back pain.  ?Neurological:  Negative for dizziness and headaches.  ?Psychiatric/Behavioral:  Negative for behavioral problems and confusion.   ? ?Vital Signs: ?BP (!) 137/95 (BP Location: Left Arm)   Pulse 69   Temp 97.6 ?F (36.4 ?C) (Oral)   Resp 20   Ht '5\' 3"'$  (1.6 m)   Wt 136 lb 11 oz (62 kg)   SpO2 100%   BMI 24.21 kg/m?  ? ?Physical Exam ?Vitals and nursing note reviewed.  ?  Constitutional:   ?   General: He is not in acute distress. ?   Appearance: He is well-developed. He is not ill-appearing.  ?   Comments: In pain  ?Cardiovascular:  ?   Rate and Rhythm: Normal rate and regular rhythm.  ?Pulmonary:  ?   Effort: Pulmonary effort is normal.  ?   Breath sounds: Normal breath sounds.  ?Abdominal:  ?   General: Abdomen is flat. Bowel sounds are normal. There is no distension.  ?Skin: ?   General: Skin is warm and dry.  ?Neurological:  ?   General: No focal deficit present.  ?   Mental Status: He is alert and oriented to person, place, and time.  ?Psychiatric:     ?   Mood and Affect: Mood is anxious.  ? ? ? ?MD Evaluation ?Airway: WNL ?Heart: WNL ?Abdomen: WNL ?Chest/ Lungs: WNL ?ASA  Classification: 3 ?Mallampati/Airway Score: Two ? ? ?Imaging: ?CT CHEST W CONTRAST ? ?Result Date: 09/02/2021 ?CLINICAL DATA:  Nausea, vomiting. Newly diagnosed pancreatic cancer. EXAM: CT CHEST WITH CONTRAST TECHNIQUE: Multidetector CT imaging of the chest was performed during intravenous contrast administration. RADIATION DOSE REDUCTION: This exam was performed according to the departmental dose-optimization program which includes automated exposure control, adjustment of the mA and/or kV according to patient size and/or use of iterative  reconstruction technique. CONTRAST:  31m ISOVUE-300 IOPAMIDOL (ISOVUE-300) INJECTION 61% COMPARISON:  01/20/2021 FINDINGS: Cardiovascular: Heart is normal size. Aorta is normal caliber. Mediastinum/Nodes: No mediastinal, hilar, or axillary aden

## 2021-09-16 NOTE — Assessment & Plan Note (Signed)
-  He was recently diagnosed ?-However, he was unable to complete his initial dose of chemotherapy due to intractable pain (despite IV Dilaudid) ?-He cannot get surgery until his chemo is underway (completed?) ?-Hopefully this celiac plexus block will control his pain sufficiently to enable treatment ?

## 2021-09-16 NOTE — ED Notes (Signed)
No mouse or scanner on pt's room, no wow available at this time, unable to wait for meds to be scanned due to pt been a a lot of pain. ?

## 2021-09-16 NOTE — Consult Note (Signed)
[facility] Followup Face-to-Face Psychiatric Evaluation ? ? ?Service Date: September 16, 2021 ?LOS:  LOS: 0 days  ? ? ?Assessment  ?Tony Elliott is a 41 y.o. male admitted medically for 09/15/2021 10:23 PM for severe pain 2/2 pancreatic cancer. He carries the psychiatric diagnoses of Bipolar (unlikely), Personality Personality disorder, unspecified, anxiety, Polysubstance use and has a past medical history of  pancreatitis, pancreatic cancer (chemo started 09/2020), and Hep .Psychiatry was consulted for concern for adjustment to cancer dx by Karmen Bongo, MD.  ? ? ?His current presentation of irritability and difficult to sustain attention is most consistent with current pain regimen and pain. Patient has no hx of outpatient psychotropic medication beyond gabapentin after Etoh detox. On initial examination, patient is irritable. Please see plan below for detailed recommendations.  ? ? ? Patient does have hx of requiring Haldol for overnight agitation in 02/2021 when admitted with subacute pancreatitis. Patient appears to be sedated with his current pain regimen; however he could benefit from a PRN agitation protocol. Patient is noted to have a hx of intellectual disorder and was previously documented as illiterate. He did appear concrete on assessment today.  There is some concern that patient may be making statements that he does not have the cognitive ability to understand the gravity of his statements. Will attempt to reassess tom. Based on patient's sedated appearance this AM, at this time we will likely not add more scheduled sedating meds. Will continue to monitor.  ? ?Diagnoses:  ?Active Hospital problems: ?Principal Problem: ?  Intractable abdominal pain ?  ? ? ?Plan  ?## Safety and Observation Level:  ?- Based on my clinical evaluation, I estimate the patient to be at low risk of self harm but endorses HI in the current setting ?- At this time, we recommend a 1:1 level of observation. This  decision is based on my review of the chart including patient's history and current presentation, interview of the patient, mental status examination, and consideration of suicide risk including evaluating suicidal ideation, plan, intent, suicidal or self-harm behaviors, risk factors, and protective factors. This judgment is based on our ability to directly address suicide risk, implement suicide prevention strategies and develop a safety plan while the patient is in the clinical setting. Please contact our team if there is a concern that risk level has changed. ? ? ?## Medications:  ?-- Seroquel '25mg'$  PRN for agitation ? ? ?## Medical Decision Making Capacity:  ?-- Not formally assessed ? ?## Further Work-up:  ?-- Per primary ? ? ?-- most recent EKG on 3/16 had QtC of 473 ?-- Pertinent labwork reviewed earlier this admission includes: Lipase 25 ? ?## Disposition:  ?-- Per primary ? ?## Behavioral / Environmental:  ?-- Verbal reorientation ? ?##Legal Status ?-- Recommend IVC if patient attempts to elope ? ?Thank you for this consult request. Recommendations have been communicated to the primary team.  We will will continue to follow at this time.  ? ?PGY-2 ?Freida Busman, MD ? ? ?followup history  ?Relevant Aspects of Hospital Course:  ?Admitted on 09/15/2021 for pain 2/2 pancreatic cancer. ? ?Patient Report:  ?Patient is aware that psychiatry was asked to come see him. He knows he is in a hospital but cannot provide the name. He states "4/20" when asked what year it is. Then states it is 2021. Stared intently off into the distance but denied any hallucinations. Denies being worried about anything. Aware of pancreatic cancer diagnosis and knows he started  chemo recently. Is not scared of chemo and is more concerned about pain. Wants to finish chemo but feels like his body won't let him finish it. Patient noted to be sedated and falling asleep midsentence, endorsed that he would be ok for providers to come by after  lunch. ?Per EMR, patient got dilaudid 0.5 mg and lorazepam 1 mg about 1 hour prior to first assessment.  ? ?On reattempt patient reports that he is hungry and has not yet received lunch. Patient reports that he refuses to participate in assessment  until he can eat. Patient was irritable and when asked safety questions he denied SI and AVH. However he endorsed HI "towards the person that did my pancrease biopsy." Patient was noted to become teary- eyed but did not cry when he said this and endorsed that he felt that the person did not care about him, " they never came to check on me." Patient endorses that he does not have a plan, but was adamant that he wanted this person to hurt like he had.  ? ? ?Collateral information:  ?Does not give permission for this team to contact mom. ? ?Psychiatric History:  ?Information collected from EMS ?Polysubstance hx ?Personality disorder, nos ?Etoh use disorder, hx ?INPT at [facility]: 3/18, 12/2014 ? ?Family psych history: Defer ? ? ?Social History:  ?Illiterate, per EMR ?- Mom appears to be primary caretaker based on EMR ? ?Tobacco use: Defer ?Alcohol use:  Defer ?Drug use: Defer ? ?Family History:  ? ?The patient's family history includes Diabetes in his mother; Heart disease in his mother; High blood pressure in his mother. ? ?Medical History: ?Past Medical History:  ?Diagnosis Date  ? Anxiety   ? Bipolar disorder ([place])   ? Cancer [facility])   ? Chronic hepatitis C ([place])   ? Depression   ? GERD (gastroesophageal reflux disease)   ? H. pylori infection   ? History of respiratory failure   ? Hypercholesterolemia   ? Intentional drug overdose ([place])   ? with multiple suicidal gestures in the past  ? PTSD (post-traumatic stress disorder)   ? Thyroid disease   ? ? ?Surgical History: ?Past Surgical History:  ?Procedure Laterality Date  ? ESOPHAGOGASTRODUODENOSCOPY  11/19/2010  ? Normal EGD  ? ESOPHAGOGASTRODUODENOSCOPY (EGD) WITH PROPOFOL N/A 08/19/2021  ? Procedure:  ESOPHAGOGASTRODUODENOSCOPY (EGD) WITH PROPOFOL;  Surgeon: Milus Banister, MD;  [facility];  Service: Endoscopy;  Laterality: N/A;  ? EUS N/A 08/19/2021  ? Procedure: UPPER ENDOSCOPIC ULTRASOUND (EUS) RADIAL;  Surgeon: Milus Banister, MD;  [facility];  Service: Endoscopy;  Laterality: N/A;  ? FINE NEEDLE ASPIRATION N/A 08/19/2021  ? Procedure: FINE NEEDLE ASPIRATION (FNA) LINEAR;  Surgeon: Milus Banister, MD;  [facility];  Service: Endoscopy;  Laterality: N/A;  ? ? ?Medications:  ? ?Current Facility-Administered Medications:  ?  acetaminophen (TYLENOL) tablet 650 mg, 650 mg, Oral, Q6H PRN **OR** acetaminophen (TYLENOL) suppository 650 mg, 650 mg, Rectal, Q6H PRN, Karmen Bongo, MD ?  hydrALAZINE (APRESOLINE) injection 5 mg, 5 mg, Intravenous, Q4H PRN, Karmen Bongo, MD ?  HYDROmorphone (DILAUDID) injection 0.5 mg, 0.5 mg, Intravenous, Q4H PRN, Karmen Bongo, MD, 0.5 mg at 09/16/21 1329 ?  ketorolac (TORADOL) 30 MG/ML injection 30 mg, 30 mg, Intravenous, Q6H, Karmen Bongo, MD ?  lactated ringers infusion, , Intravenous, Continuous, Karmen Bongo, MD, Last Rate: 75 mL/hr at 09/16/21 0922, New Bag at 09/16/21 1610 ?  LORazepam (ATIVAN) injection 1 mg, 1 mg, Intravenous,  Q4H PRN, Karmen Bongo, MD, 1 mg at 09/16/21 7793 ?  nicotine (NICODERM CQ - dosed in mg/24 hours) patch 14 mg, 14 mg, Transdermal, Daily, Karmen Bongo, MD ?  ondansetron [facility]) tablet 4 mg, 4 mg, Oral, Q6H PRN **OR** ondansetron (ZOFRAN) injection 4 mg, 4 mg, Intravenous, Q6H PRN, Karmen Bongo, MD ?  pantoprazole (PROTONIX) EC tablet 40 mg, 40 mg, Oral, Daily, Karmen Bongo, MD, 40 mg at 09/16/21 0910 ?  QUEtiapine (SEROQUEL) tablet 25 mg, 25 mg, Oral, Q8H PRN, Freida Busman, MD ? ?Allergies: ?Allergies  ?Allergen Reactions  ? Ketorolac Tromethamine Other (See Comments)  ?  "makes me angry"  ? Ultram [Tramadol Hcl] Other (See Comments)  ?  "makes me angry"  ? Ketorolac Tromethamine Other  (See Comments)  ?  "makes me angry" ?"makes me angry"  ? Tramadol Other (See Comments)  ?  "makes me angry" ?"makes me angry"  ? ? ? ?  ?Objective  ?Vital signs:  ?Temp:  [97.6 ?F (36.4 ?C)-99 ?F (37.2 ?C)] 97.6 ?F (

## 2021-09-16 NOTE — ED Notes (Signed)
Pt in bed with eyes closed, resps even and unlabored.  

## 2021-09-16 NOTE — H&P (Signed)
?History and Physical  ? ? ?Patient: Tony Elliott GYK:[id number] DOB: 02/13/1981 ?DOA: 09/15/2021 ?DOS: the patient was seen and examined on 09/16/2021 ?PCP: Imagene Riches, NP  ?Patient coming from: Home - lives with mother; [place]: Mother, [place] Poot, [phone] ? ? ?Chief Complaint: Uncontrolled pain ? ?HPI: Tony Elliott is a 41 y.o. male with medical history significant of bipolar d/o; Hep C; HLD; and pancreatic cancer undergoing chemotherapy presenting with uncontrolled pain. He was last seen in the [facility] to prepare for starting chemo on 3.3/  He has stage 1B mucinous adenocarcinoma and is planned for treatment with FOLFIRINOX q14d x 4 cycles.  It appears that he went for his initial chemo session on 3/8 but then signed out AMA to go to the ER for pain control.  He appears to have declined treatment at that time but ultimately stayed for the duration of treatment.  However, he has been unhappy with his pain medicine regimen.  Dr. Bobby Rumpf then sent referral to IR for cardiac plexus nerve block to help with pain control, scheduled for tomorrow at 230.  "He must arrive for this consult in order to be scheduled next week with the surgeon."  His current pain regimen is Fentanyl patches 37 mcg + oxy 20 mg q4h prn.   ? ?He  has been having symptoms almost a year.  He had an attack of pancreatitis about a year ago.  He went to [facility] and was diagnosed with gas/constipation.  He left there and went to [facility] and his lipase was in the 1000s.  He had a few more episodes.  CT showed a mass on the pancreas.  He had a biopsy on 2/16 and he was diagnosed with pancreatic cancer. His primary issue is pain and an inability to control it.  Since the biopsy, his pain is constant.  He went for one session of chemo and was unable to complete the treatment due to pain.  He almost completed the treatment but he called 911 at the center.  His pain meds are not touching the pain. ? ? ? ?ER Course:  Carryover, per Dr.  Bridgett Larsson: ? ?41 year old male with a history of pancreatic cancer.  Currently undergoing chemotherapy.  Presented with uncontrolled pain.  He is on oral oxycodone 20 mg every 4-6 hours along with fentanyl patches 25 mcg/h.  Patient received multiple rounds of IV Dilaudid without improvement.  EDP wants the patient be admitted for uncontrolled pain.  Sounds like he needs to be admitted over to [facility] long.  Did not put the orders in for admission yet in case he gets transferred to [facility] long before he seen by the admitter.  We will start Dilaudid PCA. ? ? ? ? ?Review of Systems: As mentioned in the history of present illness. All other systems reviewed and are negative. ?Past Medical History:  ?Diagnosis Date  ? Anxiety   ? Bipolar disorder ([place])   ? Cancer [facility])   ? Chronic hepatitis C ([place])   ? Depression   ? GERD (gastroesophageal reflux disease)   ? H. pylori infection   ? History of respiratory failure   ? Hypercholesterolemia   ? Intentional drug overdose ([place])   ? with multiple suicidal gestures in the past  ? PTSD (post-traumatic stress disorder)   ? Thyroid disease   ? ?Past Surgical History:  ?Procedure Laterality Date  ? ESOPHAGOGASTRODUODENOSCOPY  11/19/2010  ? Normal EGD  ? ESOPHAGOGASTRODUODENOSCOPY (EGD) WITH PROPOFOL N/A 08/19/2021  ?  Procedure: ESOPHAGOGASTRODUODENOSCOPY (EGD) WITH PROPOFOL;  Surgeon: Milus Banister, MD;  Location: [facility] ENDOSCOPY;  Service: Endoscopy;  Laterality: N/A;  ? EUS N/A 08/19/2021  ? Procedure: UPPER ENDOSCOPIC ULTRASOUND (EUS) RADIAL;  Surgeon: Milus Banister, MD;  Location: [facility] ENDOSCOPY;  Service: Endoscopy;  Laterality: N/A;  ? FINE NEEDLE ASPIRATION N/A 08/19/2021  ? Procedure: FINE NEEDLE ASPIRATION (FNA) LINEAR;  Surgeon: Milus Banister, MD;  Location: [facility] ENDOSCOPY;  Service: Endoscopy;  Laterality: N/A;  ? ?Social History:  reports that he has been smoking cigarettes. He has been smoking an average of .5 packs per day. He has never used smokeless tobacco. He reports  that he does not currently use alcohol. He reports current drug use. Drug: Marijuana. ? ?Allergies  ?Allergen Reactions  ? Ketorolac Tromethamine Other (See Comments)  ?  "makes me angry"  ? Ultram [Tramadol Hcl] Other (See Comments)  ?  "makes me angry"  ? Ketorolac Tromethamine Other (See Comments)  ?  "makes me angry" ?"makes me angry"  ? Tramadol Other (See Comments)  ?  "makes me angry" ?"makes me angry"  ? ? ?Family History  ?Problem Relation Age of Onset  ? Heart disease Mother   ? High blood pressure Mother   ? Diabetes Mother   ? Colon cancer Neg Hx   ? Pancreatic cancer Neg Hx   ? Rectal cancer Neg Hx   ? Stomach cancer Neg Hx   ? ? ?Prior to Admission medications   ?Medication Sig Start Date End Date Taking? Authorizing Provider  ?ibuprofen (ADVIL) 200 MG tablet Take 200 mg by mouth every 6 (six) hours as needed for headache or moderate pain.   Yes [provider]  ?loperamide (IMODIUM) 2 MG capsule Take 2 at onset of diarrhea, then 1 every 2hrs until 12hr without a BM. May take 2 tab every 4hrs at bedtime. If diarrhea recurs repeat. ?Patient taking differently: Take 2-4 mg by mouth See admin instructions. Take 2 at onset of diarrhea, then 1 every 2hrs until 12hr without a BM. May take 2 tab every 4hrs at bedtime. If diarrhea recurs repeat. 09/03/21  Yes Lewis, Dequincy A, MD  ?naloxone [facility]) nasal spray 4 mg/0.1 mL Place 1 spray into the nose as needed (opoid overdose). 06/29/21  Yes [provider]  ?ondansetron (ZOFRAN) 8 MG tablet Take 1 tablet (8 mg total) by mouth 2 (two) times daily as needed. Start on day 3 after chemotherapy. ?Patient taking differently: Take 8 mg by mouth 2 (two) times daily as needed for vomiting or nausea. Start on day 3 after chemotherapy. 09/03/21  Yes Lewis, Dequincy A, MD  ?Oxycodone HCl 20 MG TABS Take 1 tablet (20 mg total) by mouth every 4 (four) hours as needed. ?Patient taking differently: Take 20 mg by mouth every 4 (four) hours as needed (pain).  09/09/21  Yes Dayton Scrape A, NP  ?pantoprazole (PROTONIX) 40 MG tablet Take 1 tablet (40 mg total) by mouth daily. 05/26/21  Yes Jackquline Denmark, MD  ?prochlorperazine (COMPAZINE) 10 MG tablet Take 1 tablet (10 mg total) by mouth every 6 (six) hours as needed for nausea or vomiting. 09/01/21  Yes Dayton Scrape A, NP  ?dexamethasone (DECADRON) 4 MG tablet Take 2 tablets (8 mg total) by mouth daily. Start the day after chemotherapy for 3 days. Take with food. 09/03/21   Lewis, Dequincy A, MD  ?fentaNYL (DURAGESIC) 12 MCG/HR Place 1 patch onto the skin every 3 (three) days. ?Patient not taking: Reported on  09/16/2021 09/03/21   Dayton Scrape A, NP  ?ondansetron (ZOFRAN) 4 MG tablet Take 1 tablet (4 mg total) by mouth every 4 (four) hours as needed for nausea. ?Patient not taking: Reported on 09/16/2021 09/01/21   Melodye Ped, NP  ? ? ?Physical Exam: ?Vitals:  ? 09/16/21 0758 09/16/21 1216 09/16/21 1436 09/16/21 1838  ?BP: (!) 138/103 (!) 126/94 (!) 137/95 (!) 129/92  ?Pulse: 98 63 69 67  ?Resp: '17 12 20 18  '$ ?Temp: 97.9 ?F (36.6 ?C)  97.6 ?F (36.4 ?C) 98.6 ?F (37 ?C)  ?TempSrc:   Oral Oral  ?SpO2: 100% 98% 100% 100%  ?Weight:      ?Height:      ? ?General:  Appears agitated/anxious  ?Eyes:  PERRL, EOMI, normal lids, iris ?ENT:  grossly normal hearing, lips & tongue, mmm ?Neck:  no LAD, masses or thyromegaly ?Cardiovascular:  RRR, no m/r/g. No LE edema.  ?Respiratory:   CTA bilaterally with no wheezes/rales/rhonchi.  Normal respiratory effort. ?Abdomen:  soft, LUQ TTP, ND ?Skin:  no rash or induration seen on limited exam ?Musculoskeletal:  grossly normal tone BUE/BLE, good ROM, no bony abnormality ?Psychiatric:  anxious mood and affect, speech pressured but appropriate, AOx3, itching and writhing periodically throughout exam ?Neurologic:  CN 2-12 grossly intact, moves all extremities in coordinated fashion ? ? ?Radiological Exams on Admission: ?Independently reviewed - see discussion in A/P where applicable ? ?CT  Angio Chest PE W and/or Wo Contrast ? ?Result Date: 09/16/2021 ?CLINICAL DATA:  Chest and abdominal pain. EXAM: CT ANGIOGRAPHY CHEST CT ABDOMEN AND PELVIS WITH CONTRAST TECHNIQUE: Multidetector CT imaging of the ch

## 2021-09-16 NOTE — Progress Notes (Signed)
Pt arrived to room 5M11 from the ED. Received report from [place], Therapist, sports. See assessment. Will continue to monitor.  ?

## 2021-09-16 NOTE — Plan of Care (Signed)

## 2021-09-16 NOTE — TOC Initial Note (Signed)
Transition of Care (TOC) - Initial/Assessment Note  ? ? ?Patient Details  ?Name: Tony Elliott ?MRN: [id number] ?Date of Birth: 10/03/1980 ? ?Transition of Care (TOC) CM/SW Contact:    ?Tom-Johnson, Renea Ee, RN ?Phone Number: ?09/16/2021, 4:40 PM ? ?Clinical Narrative:                 ? ?CM spoke with patient at bedside about needs for post hospital transition. Admitted for Intractable abdominal pain. States his mother lives with him. Has two brothers. Currently on disability due to his Mental Disorders. Independent with care. States his mother drives him to his appointments. Does not drive. Does not have any DME's.   ?PCP is Imagene Riches, NP and uses [facility] in [place]. No recommendations or needs noted at this time. CM will continue to follow with needs.  ?  ?  ? ? ?Patient Goals and CMS Choice ?  ?  ?  ? ?Expected Discharge Plan and Services ?  ?  ?  ?  ?  ?                ?  ?  ?  ?  ?  ?  ?  ?  ?  ?  ? ?Prior Living Arrangements/Services ?  ?  ?  ?       ?  ?  ?  ?  ? ?Activities of Daily Living ?  ?  ? ?Permission Sought/Granted ?  ?  ?   ?   ?   ?   ? ?Emotional Assessment ?  ?  ?  ?  ?  ?  ? ?Admission diagnosis:  Malignant neoplasm of body of pancreas ([place]) [C25.1] ?Intractable abdominal pain [R10.9] ?Uncontrolled pain [R52] ?Abdominal pain [R10.9] ?Patient Active Problem List  ? Diagnosis Date Noted  ? Intractable abdominal pain 09/16/2021  ? Pancreatic adenocarcinoma ([place]) 08/24/2021  ? PTSD (post-traumatic stress disorder) 08/23/2021  ? Lymphadenopathy, inguinal 08/23/2021  ? Illiterate 08/23/2021  ? Hepatitis C antibody positive in blood 08/23/2021  ? Constipation 08/23/2021  ? ADHD (attention deficit hyperactivity disorder) 08/23/2021  ? Pancreatic cancer ([place])   ? Disability, developmental 02/16/2021  ? Subacute pancreatitis 02/14/2021  ? Marijuana abuse 09/15/2016  ? Alcohol abuse 09/15/2016  ? Episodic mood disorder ([place]) 12/26/2014  ? Hypercholesterolemia 12/08/2012  ? Anxiety  state 12/08/2012  ? ?PCP:  Imagene Riches, NP ?Pharmacy:   ?[id number] ?[address] ?[place] [address] ?Phone: [phone] Fax: [phone] ? ? ? ? ?Social Determinants of Health (SDOH) Interventions ?  ? ?Readmission Risk Interventions ?No flowsheet data found. ? ? ?

## 2021-09-16 NOTE — ED Notes (Signed)
Provided pt with sandwich bag and drink.  ? ?

## 2021-09-16 NOTE — ED Provider Notes (Signed)
?[facility] ?Provider Note ? ? ?CSN: [id number] ?Arrival date & time: 09/15/21  2223 ? ?  ? ?History ? ?Chief Complaint  ?Patient presents with  ? Abdominal Pain  ?  Pancreatic Cancer  ? ? ?Tony Elliott is a 41 y.o. male. ? ?HPI ? ?Patient with medical history including hep C, anxiety, bipolar, pancreatic cancer receiving active chemotherapy presents with complaints of epigastric/left upper quadrant tenderness.  Patient states has been having this pain since he had a pancreatic biopsy last month.  He states the pain is constant, does not radiate, remains in his left upper quadrant, states it hurts more when he takes in a breath, but denies actual chest pain or shortness of breath, he has no associated nausea or vomiting no constipation or diarrhea states she is still tolerating p.o.  He states that he is here because he is unable to control his pain despite using his at home narcotic medication, he denies any fevers or chills congestion cough or general body aches.  Patient has no significant abdominal surgeries, he denies alcohol use or NSAID use, no history of GI bleeds, no history of diverticulitis. ? ?I have reviewed patient's chart [facility], Dr. Lauretta Chester who has started him on adjunct chemotherapy.  Patient's surgeon is Dr. Michaelle Birks of [facility] who will proceed with definitive surgery after patient completes adjuvant chemotherapy.  Reviewed patient's chart patient's home regimen is 20 mg of oxycodone up to 4 times daily.  Recently refilled on March 9 for additional 15 more days. ? ?Home Medications ?Prior to Admission medications   ?Medication Sig Start Date End Date Taking? Authorizing Provider  ?ibuprofen (ADVIL) 200 MG tablet Take 200 mg by mouth every 6 (six) hours as needed for headache or moderate pain.   Yes [provider]  ?loperamide (IMODIUM) 2 MG capsule Take 2 at onset of diarrhea, then 1 every 2hrs until 12hr without a  BM. May take 2 tab every 4hrs at bedtime. If diarrhea recurs repeat. ?Patient taking differently: Take 2-4 mg by mouth See admin instructions. Take 2 at onset of diarrhea, then 1 every 2hrs until 12hr without a BM. May take 2 tab every 4hrs at bedtime. If diarrhea recurs repeat. 09/03/21  Yes Lewis, Dequincy A, MD  ?naloxone [facility]) nasal spray 4 mg/0.1 mL Place 1 spray into the nose as needed (opoid overdose). 06/29/21  Yes [provider]  ?ondansetron (ZOFRAN) 8 MG tablet Take 1 tablet (8 mg total) by mouth 2 (two) times daily as needed. Start on day 3 after chemotherapy. ?Patient taking differently: Take 8 mg by mouth 2 (two) times daily as needed for vomiting or nausea. Start on day 3 after chemotherapy. 09/03/21  Yes Lewis, Dequincy A, MD  ?Oxycodone HCl 20 MG TABS Take 1 tablet (20 mg total) by mouth every 4 (four) hours as needed. ?Patient taking differently: Take 20 mg by mouth every 4 (four) hours as needed (pain). 09/09/21  Yes Dayton Scrape A, NP  ?pantoprazole (PROTONIX) 40 MG tablet Take 1 tablet (40 mg total) by mouth daily. 05/26/21  Yes Jackquline Denmark, MD  ?prochlorperazine (COMPAZINE) 10 MG tablet Take 1 tablet (10 mg total) by mouth every 6 (six) hours as needed for nausea or vomiting. 09/01/21  Yes Dayton Scrape A, NP  ?dexamethasone (DECADRON) 4 MG tablet Take 2 tablets (8 mg total) by mouth daily. Start the day after chemotherapy for 3 days. Take with food. 09/03/21   Lewis, Dequincy A,  MD  ?fentaNYL (DURAGESIC) 12 MCG/HR Place 1 patch onto the skin every 3 (three) days. ?Patient not taking: Reported on 09/16/2021 09/03/21   Dayton Scrape A, NP  ?ondansetron (ZOFRAN) 4 MG tablet Take 1 tablet (4 mg total) by mouth every 4 (four) hours as needed for nausea. ?Patient not taking: Reported on 09/16/2021 09/01/21   Melodye Ped, NP  ?   ? ?Allergies    ?Ketorolac tromethamine, Ultram [tramadol hcl], Ketorolac tromethamine, and Tramadol   ? ?Review of Systems   ?Review of Systems   ?Constitutional:  Negative for chills and fever.  ?Respiratory:  Negative for shortness of breath.   ?Cardiovascular:  Negative for chest pain.  ?Gastrointestinal:  Positive for abdominal pain. Negative for nausea and vomiting.  ?Neurological:  Negative for headaches.  ? ?Physical Exam ?Updated Vital Signs ?BP (!) 144/107   Pulse 99   Temp 99 ?F (37.2 ?C) (Oral)   Resp (!) 25   Ht '5\' 3"'$  (1.6 m)   Wt 62 kg   SpO2 98%   BMI 24.21 kg/m?  ?Physical Exam ?Vitals and nursing note reviewed.  ?Constitutional:   ?   General: He is not in acute distress. ?   Appearance: He is not ill-appearing.  ?HENT:  ?   Head: Normocephalic and atraumatic.  ?   Nose: No congestion.  ?Eyes:  ?   Conjunctiva/sclera: Conjunctivae normal.  ?Cardiovascular:  ?   Rate and Rhythm: Regular rhythm. Tachycardia present.  ?   Pulses: Normal pulses.  ?   Heart sounds: No murmur heard. ?  No friction rub. No gallop.  ?Pulmonary:  ?   Effort: No respiratory distress.  ?   Breath sounds: No wheezing, rhonchi or rales.  ?Abdominal:  ?   Palpations: Abdomen is soft.  ?   Tenderness: There is abdominal tenderness. There is no right CVA tenderness or left CVA tenderness.  ?   Comments: Abdomen nondistended normal active bowel sounds, dull to percussion, had noted tenderness in his epigastric and left upper quadrant, there is no guarding, rebound tenderness, peritoneal sign negative Murphy sign McBurney point no CVA tenderness.  ?Skin: ?   General: Skin is warm and dry.  ?   Comments: Patient has a port in the right upper quadrant of his chest no overlying skin changes no flexion induration present.  ?Neurological:  ?   Mental Status: He is alert.  ?   Comments: Alert and orient x4 no facial asymmetry, no difficulty word finding, able to step commands, no unilateral weakness present.  ?Psychiatric:     ?   Mood and Affect: Mood normal.  ? ? ?ED Results / Procedures / Treatments   ?Labs ?(all labs ordered are listed, but only abnormal results are  displayed) ?Labs Reviewed  ?COMPREHENSIVE METABOLIC PANEL - Abnormal; Notable for the following components:  ?    Result Value  ? Glucose, Bld 110 (*)   ? Calcium 8.6 (*)   ? Total Protein 6.3 (*)   ? All other components within normal limits  ?CBC WITH DIFFERENTIAL/PLATELET - Abnormal; Notable for the following components:  ? RBC 4.20 (*)   ? All other components within normal limits  ?RESP PANEL BY RT-PCR (FLU A&B, COVID) [id number]  ?LACTIC ACID, PLASMA  ?LIPASE, BLOOD  ?LACTIC ACID, PLASMA  ? ? ?EKG ?None ? ?Radiology ?CT Angio Chest PE W and/or Wo Contrast ? ?Result Date: 09/16/2021 ?CLINICAL DATA:  Chest and abdominal pain. EXAM: CT ANGIOGRAPHY CHEST CT ABDOMEN  AND PELVIS WITH CONTRAST TECHNIQUE: Multidetector CT imaging of the chest was performed using the standard protocol during bolus administration of intravenous contrast. Multiplanar CT image reconstructions and MIPs were obtained to evaluate the vascular anatomy. Multidetector CT imaging of the abdomen and pelvis was performed using the standard protocol during bolus administration of intravenous contrast. RADIATION DOSE REDUCTION: This exam was performed according to the departmental dose-optimization program which includes automated exposure control, adjustment of the mA and/or kV according to patient size and/or use of iterative reconstruction technique. CONTRAST:  71m OMNIPAQUE IOHEXOL 350 MG/ML SOLN COMPARISON:  Chest CT dated 09/01/2021. FINDINGS: CTA CHEST FINDINGS Cardiovascular: There is no cardiomegaly or pericardial effusion. The thoracic aorta is unremarkable. No pulmonary artery embolus identified. Mediastinum/Nodes: No hilar or mediastinal adenopathy. The esophagus and the thyroid gland are grossly unremarkable. No mediastinal fluid collection. Probable residual thymic tissue in the anterior mediastinum Lungs/Pleura: There is a 5 mm right middle lobe nodule. No focal consolidation, pleural effusion, or pneumothorax. The central airways are  patent. Musculoskeletal: Right-sided Port-A-Cath with tip at the cavoatrial junction. No acute osseous pathology. Review of the MIP images confirms the above findings. CT ABDOMEN and PELVIS FINDINGS No intra-abdominal free air or

## 2021-09-16 NOTE — Assessment & Plan Note (Signed)
-  Patient is a young man with a bad disease - recent diagnosis of pancreatic cancer ?-He has been demonstrating escalating drug seeking behaviors and refusing meds (Duragesic patch) and treatments (chemotherapy) as a result ?-He has been referred for a celiac plexus block with IR and his initial consultation was scheduled for tomorrow ?-I have consulted IR and the procedure will now be performed tomorrow (assuming the patient agrees - he is currently harboring homicidal thoughts about the person who did his pancreas biopsy) ?-For now, the goal is to limit narcotic pain control as much as possible while controlling his pain ?-Will start standing Toradol and Tylenol and add prn Dilaudid (2 mg at pharmacy recommendation based on prior home meds) q4h for breakthrough pain ?-He also clearly has a substantial psychogenic component to his pain and will benefit from adjuvant therapy such as duloxetine, gabapentin, and/or amitriptyline (will defer to psych) ?-For chronic pain control with his h/o substance abuse, methadone may be a consideration ?

## 2021-09-17 ENCOUNTER — Inpatient Hospital Stay (HOSPITAL_COMMUNITY): Payer: Medicare Other

## 2021-09-17 LAB — RAPID URINE DRUG SCREEN, HOSP PERFORMED
Amphetamines: POSITIVE — AB
Barbiturates: NOT DETECTED
Benzodiazepines: POSITIVE — AB
Cocaine: NOT DETECTED
Opiates: POSITIVE — AB
Tetrahydrocannabinol: NOT DETECTED

## 2021-09-17 LAB — CBC
HCT: 35.9 % — ABNORMAL LOW (ref 39.0–52.0)
Hemoglobin: 12.4 g/dL — ABNORMAL LOW (ref 13.0–17.0)
MCH: 31.3 pg (ref 26.0–34.0)
MCHC: 34.5 g/dL (ref 30.0–36.0)
MCV: 90.7 fL (ref 80.0–100.0)
Platelets: 191 10*3/uL (ref 150–400)
RBC: 3.96 MIL/uL — ABNORMAL LOW (ref 4.22–5.81)
RDW: 12.3 % (ref 11.5–15.5)
WBC: 4.9 10*3/uL (ref 4.0–10.5)
nRBC: 0 % (ref 0.0–0.2)

## 2021-09-17 LAB — BASIC METABOLIC PANEL
Anion gap: 12 (ref 5–15)
BUN: 16 mg/dL (ref 6–20)
CO2: 23 mmol/L (ref 22–32)
Calcium: 8.8 mg/dL — ABNORMAL LOW (ref 8.9–10.3)
Chloride: 102 mmol/L (ref 98–111)
Creatinine, Ser: 0.84 mg/dL (ref 0.61–1.24)
GFR, Estimated: >60 mL/min (ref >60)
Glucose, Bld: 96 mg/dL (ref 70–99)
Potassium: 4 mmol/L (ref 3.5–5.1)
Sodium: 137 mmol/L (ref 135–145)

## 2021-09-17 MED ORDER — TRIAMCINOLONE ACETONIDE 40 MG/ML IJ SUSP
80.0000 mg | Freq: Once | INTRAMUSCULAR | Status: DC
  Filled 2021-09-17: qty 2

## 2021-09-17 MED ORDER — MIDAZOLAM HCL 2 MG/2ML IJ SOLN
INTRAMUSCULAR | Status: DC | PRN
  Administered 2021-09-17 (×2): 1 mg via INTRAVENOUS

## 2021-09-17 MED ORDER — FENTANYL CITRATE (PF) 100 MCG/2ML IJ SOLN
INTRAMUSCULAR | Status: DC | PRN
  Administered 2021-09-17 (×2): 50 ug via INTRAVENOUS

## 2021-09-17 MED ORDER — QUETIAPINE FUMARATE 25 MG PO TABS
25.0000 mg | ORAL_TABLET | Freq: Three times a day (TID) | ORAL | Status: DC | PRN
Start: 2021-09-17 — End: 2021-09-18
  Administered 2021-09-17: 25 mg via ORAL
  Filled 2021-09-17 (×2): qty 1

## 2021-09-17 MED ORDER — FENTANYL CITRATE (PF) 100 MCG/2ML IJ SOLN
INTRAMUSCULAR | Status: AC
Start: 2021-09-17 — End: 2021-09-18
  Filled 2021-09-17: qty 6

## 2021-09-17 MED ORDER — LORAZEPAM 2 MG/ML IJ SOLN: 1.0000 mg | INTRAMUSCULAR | Status: DC | PRN

## 2021-09-17 MED ORDER — RISPERIDONE 0.5 MG PO TABS: 0.5000 mg | ORAL_TABLET | Freq: Every day | ORAL | Status: DC

## 2021-09-17 MED ORDER — LIDOCAINE HCL 1 % IJ SOLN
INTRAMUSCULAR | Status: AC
  Filled 2021-09-17: qty 10

## 2021-09-17 MED ORDER — OXYCODONE HCL 5 MG PO TABS
10.0000 mg | ORAL_TABLET | ORAL | Status: DC | PRN
Start: 2021-09-17 — End: 2021-09-18
  Administered 2021-09-17 – 2021-09-18 (×2): 10 mg via ORAL
  Filled 2021-09-17 (×2): qty 2

## 2021-09-17 MED ORDER — HALOPERIDOL LACTATE 5 MG/ML IJ SOLN
5.0000 mg | Freq: Four times a day (QID) | INTRAMUSCULAR | Status: DC | PRN
  Administered 2021-09-17: 5 mg via INTRAMUSCULAR
  Filled 2021-09-17 (×2): qty 1

## 2021-09-17 MED ORDER — DIPHENHYDRAMINE HCL 50 MG/ML IJ SOLN
25.0000 mg | Freq: Four times a day (QID) | INTRAMUSCULAR | Status: DC | PRN
  Filled 2021-09-17: qty 1

## 2021-09-17 MED ORDER — MIDAZOLAM HCL 2 MG/2ML IJ SOLN
INTRAMUSCULAR | Status: AC
  Filled 2021-09-17: qty 8

## 2021-09-17 MED ORDER — RISPERIDONE 1 MG PO TABS
1.0000 mg | ORAL_TABLET | Freq: Every day | ORAL | Status: DC
  Administered 2021-09-17: 1 mg via ORAL
  Filled 2021-09-17 (×2): qty 1

## 2021-09-17 MED ORDER — BUPIVACAINE HCL (PF) 0.5 % IJ SOLN
30.0000 mL | INTRAMUSCULAR | Status: AC
  Filled 2021-09-17: qty 30

## 2021-09-17 MED ORDER — ADULT MULTIVITAMIN W/MINERALS CH
1.0000 | ORAL_TABLET | Freq: Every day | ORAL | Status: DC
  Administered 2021-09-17 – 2021-09-18 (×2): 1 via ORAL
  Filled 2021-09-17 (×2): qty 1

## 2021-09-17 NOTE — Consult Note (Addendum)
I have independently evaluated the patient during a face-to-face assessment on 09/17/21. I reviewed the patient's chart, and I participated in key portions of the service. I discussed the case with the [employer], and I agree with the assessment and plan of care as documented in the House Officer's note.  ? ?Have made changes to note below where necessary.  ? ?Tony Fruit, MD ? ? ?[facility] Followup Face-to-Face Psychiatric Evaluation ? ? ?Service Date: September 17, 2021 ?LOS:  LOS: 1 day  ? ? ?Assessment  ?Tony Elliott is a 41 y.o. male admitted medically for 09/15/2021 10:23 PM for severe pain 2/2 pancreatic cancer. He carries the psychiatric diagnoses of Bipolar (unlikely), Personality Personality disorder, unspecified, anxiety, Polysubstance use and has a past medical history of  pancreatitis, pancreatic cancer (chemo started 09/2020), and Hep .Psychiatry was consulted for concern for adjustment to cancer dx by Karmen Bongo, MD.  ?  ? ?Had seen pt in AM of  3/16 shortly after receiving BZD and opioid pain medications; agreed to come back after lunch. During later visit (around 2 PM) pt refused to speak to psychiatry team as he had not yet had lunch. He did endorse suicidal ideations during periods of peak pain as well as homicidal ideations toward the physician who performed his biopsy (which he sees as the cause of his pain) but refused to discuss this further. Did a thorough chart review on that date of service (see attending attestatoin) which essentially revealed a history of questionable bipolar disorder, definite EtOH and opioid use disorders, and PTSD. Additionally has remote history of charges for communicating threats/assault and had endorsed HI towards family in ?2018 when admitted for detox. Mother appears to have a history of enabling his behavior and has previously pulled over to buy pt beer on the way to detox admissions.  Have not been able to perform a diagnostic  interview due to profound irritability; pt got up out of bed and threatened to leave AMA on interview today.  ? ?3/16 On initial evaluation  pt endorsed both SI and HI (towards the doctor who performed his biopsy) in the setting of uncontrolled pain but otherwise refused to speak to the psychiatric team. At that time he had no plans to leave AMA and recommendation was to proceed with planned procedure/treat pain and perform full reassessment tomorrow.  ? ?3/17: It was decided to IVC patient on the basis of endorsing intent to leave AMA while having just endorsed within the last 3 min (for the 2nd time ) to psychiatric team that he was having HI towards a specific named individual.Per [place] vs [place], have informed Dr. Ardis Hughs of pt's threats given potential for ongoing doctor-patient relationship. No indication to file police report at this time as pt is not imminent risk to others - would potentially need to file in future depending on clinical presentation if discharged. Some concern that substance use disorder is influencing presentation (although he has very real cause for pain), as he is refusing nonopioid medications for cancer. At this point it is not clear that patient will need to go to the psychiatric hospital - unclear if homicidal ideations are stemming from treatable psychiatric disorder (ie bipolar disorder) or from expression of uncontrolled pain in pt with historically poor impulse control and executive dysfunction. Have IVC'd pt during AM due to threats to leave AMA - celiac plexus block is necessary to treat pain to the extent he is able to be compliant with outpatient  chemotherapy.  ? ?Diagnoses:  ?Active Hospital problems: ?Principal Problem: ?  Intractable abdominal pain ?Active Problems: ?  Pancreatic cancer ([place]) ?  Marijuana abuse ?  Episodic mood disorder ([place]) ?  ? ? ?Plan  ?## Safety and Observation Level:  ?- Based on my clinical evaluation, I estimate the patient to be at moderate risk of  self harm but endorses HI in the current setting ?- At this time, we recommend a 1:1 level of observation. This decision is based on my review of the chart including patient's history and current presentation, interview of the patient, mental status examination, and consideration of suicide risk including evaluating suicidal ideation, plan, intent, suicidal or self-harm behaviors, risk factors, and protective factors. This judgment is based on our ability to directly address suicide risk, implement suicide prevention strategies and develop a safety plan while the patient is in the clinical setting. Please contact our team if there is a concern that risk level has changed. ?  ?  ?## Medications:  ?-- Seroquel '25mg'$  PRN for anxiety  ? -- had 1 mg ativan q4 h ordered by primary team, this should be used secondary to seroquel ? - would not dc pt with ativan; would likely schedule 1-2 mg IV ativan prior to receiving chemotherapy to maximize chances he receives full infusion ? ?-- Risperidone 1 mg QHS for irritability, agitation.  ?-- Agitation protocol: Haldol '5mg'$  IV PRN q6h with Benadryl '25mg'$  IM for agitation ?  ?  ?## Medical Decision Making Capacity:  ?-- Not formally assessed ?  ?## Further Work-up:  ?-- Per primary ?  ?  ?-- most recent EKG on 3/16 had QtC of 473 ?-- Pertinent labwork reviewed earlier this admission includes: Lipase 25 ?  ?## Disposition:  ?-- Per primary ?-- will need to verify presence/absence of guns prior to dc ? ?## Behavioral / Environmental:  ?-- Verbal reorientation ?  ?##Legal Status ?--  Have filled out IVC paperwork. Unclear what best course forwards is at this time.  ?  ?Thank you for this consult request. Recommendations have been communicated to the primary team.  We will will continue to follow at this time.  ?  ? ?Tony Elliott ? ? ? followup history  ?Relevant Aspects of Hospital Course:  ?Admitted on 09/15/2021 for  pain 2/2 pancreatic cancer with plan to have celiac plexus  block ? ?Patient Report:  ? ?Chart review notes that patient did have his EUS pancreatic biopsy done by a physician in the [facility] system. Patient is currently in [facility].  ? ?On assessment today patient continues to endorse HI towards the person who did his biopsy. Patient refuses to report whether or not he knows the name of the person who did his procedure. Patient endorses that he does not want to speak with psychiatry team and refuses to answer most questions. Patient does endorse that he is upset about having a sitter and the safety precautions taken after his comments yesterday. When psychiatry providers continue to try to talk with patient, he lept up from his bed saying that he was leaving and started coming around towards psych team. Interview terminated at this point.  ? ?Patient's mother was in the room for the entire interaction. Patient's mother did not say anything when patient endorse HI again today, in fact patient's mother did not say or do anything except when the patient began cursing. When patient lept out of the bed and starting to come towards the psychiatry  team, patient's mother continued to sit in her chair. ? ? ?Collateral information:  ?Mother in room at time of assessment as above.  ?  ?Psychiatric History:  ?Information collected from EMS ?Polysubstance hx ?Personality disorder, nos ?Etoh use disorder, hx ?INPT at [facility]: 3/18, 12/2014 ?  ?Family psych history: Defer ?  ?  ?Social History:  ?Illiterate, per EMR ?- Mom appears to be primary caretaker based on EMR ?  ?Tobacco use: Defer ?Alcohol use:  Defer ?Drug use: Defer ? ?Family History:  ? ?The patient's family history includes Diabetes in his mother; Heart disease in his mother; High blood pressure in his mother. ? ?Medical History: ?Past Medical History:  ?Diagnosis Date  ? Anxiety   ? Bipolar disorder ([place])   ? Cancer [facility])   ? Chronic hepatitis C ([place])   ? Depression   ? GERD (gastroesophageal reflux  disease)   ? H. pylori infection   ? History of respiratory failure   ? Hypercholesterolemia   ? Intentional drug overdose ([place])   ? with multiple suicidal gestures in the past  ? PTSD (post-traumatic s

## 2021-09-17 NOTE — Progress Notes (Signed)
Tried to give patient IM halodal  for anxiety and he refuse.  Said needed pain medication.  Security had to restain paitne to get halodol in and IV came out can not give other IV meds  asked patient to get back in bed is all  wrapped around computer told him can not give him his pain medication with out and cursed at me and told me to get out  Security and NT sitter present.  Continuing to curse at all present. ?

## 2021-09-17 NOTE — Progress Notes (Signed)
?PROGRESS NOTE ? ? ? ?Tony Elliott  WUJ:[id number] DOB: 1980/12/08 DOA: 09/15/2021 ?PCP: Imagene Riches, NP ? ? ?Brief Narrative:  ?HPI: Tony Elliott is a 41 y.o. male with medical history significant of bipolar d/o; Hep C; HLD; and pancreatic cancer undergoing chemotherapy presenting with uncontrolled pain. He was last seen in the [facility] to prepare for starting chemo on 3.3/  He has stage 1B mucinous adenocarcinoma and is planned for treatment with FOLFIRINOX q14d x 4 cycles.  It appears that he went for his initial chemo session on 3/8 but then signed out AMA to go to the ER for pain control.  He appears to have declined treatment at that time but ultimately stayed for the duration of treatment.  However, he has been unhappy with his pain medicine regimen.  Dr. Bobby Rumpf then sent referral to IR for cardiac plexus nerve block to help with pain control, scheduled for tomorrow at 230.  "He must arrive for this consult in order to be scheduled next week with the surgeon."  His current pain regimen is Fentanyl patches 37 mcg + oxy 20 mg q4h prn.   ?  ?He  has been having symptoms almost a year.  He had an attack of pancreatitis about a year ago.  He went to [facility] and was diagnosed with gas/constipation.  He left there and went to [facility] and his lipase was in the 1000s.  He had a few more episodes.  CT showed a mass on the pancreas.  He had a biopsy on 2/16 and he was diagnosed with pancreatic cancer. His primary issue is pain and an inability to control it.  Since the biopsy, his pain is constant.  He went for one session of chemo and was unable to complete the treatment due to pain.  He almost completed the treatment but he called 911 at the center.  His pain meds are not touching the pain. ? ?Assessment & Plan: ?  ?Principal Problem: ?  Intractable abdominal pain ?Active Problems: ?  Pancreatic cancer ([place]) ?  Marijuana abuse ?  Episodic mood disorder ([place]) ? ?Intractable abdominal pain due to pancreatic  cancer: Patient's pain was intolerable despite of several opioids.  He was scheduled for a celiac plexus block with IR next week but IR was consulted here and he was scheduled to have this procedure today.  Patient is refusing IV pain medication so I have started him on oxycodone 10 mg p.o. every 4 hours. ?Per his mother, he is due to have next chemo on 09/22/2021. ?  ?Episodic mood disorder (HCC)/homicidal behavior/homicidal threatening/bipolar disorder: Per notes and reports, he had made a comment that he wants to kill the person who did his biopsy and wants that person to go to the pain that he is going.  He had repeated his commands this morning again today when psychiatry was seeing him.  Psychiatry had placed him on IVC now.  I myself witnessed aggressive behavior of the patient when the nurse went to give him his IV medications, he slashed everything down, threw away the standing computer system and was very difficult to control even with 3 people, 2 of them were security officers.  He later calmed down.  I had a lengthy discussion with Dr. Lovette Cliche of psychiatry who is going to talk to risk-management and take all the appropriate measures regarding this.  He is currently on Seroquel, Risperdal and as needed Ativan for anxiety.  All management deferred to psychiatry. ? ?  Marijuana abuse: Cessation encouraged yesterday. ? ?DVT prophylaxis: SCDs Start: 09/16/21 0832 ?  Code Status: Full Code  ?Family Communication: Mother present at bedside.  Plan of care discussed with the mother and also she was informed about the rationale behind IVC.  She herself was present when patient made the same comment of killing somebody when he was seen by psychiatry. ? ?Status is: Inpatient ?Remains inpatient appropriate because: Patient with abdominal pain and homicidal. ? ? ?Estimated body mass index is 24.21 kg/m? as calculated from the following: ?  Height as of this encounter: '5\' 3"'$  (1.6 m). ?  Weight as of this encounter: 62  kg. ? ?  ?Nutritional Assessment: ?Body mass index is 24.21 kg/m?Marland KitchenMarland Kitchen ?Seen by dietician.  I agree with the assessment and plan as outlined below: ?Nutrition Status: ?  ?  ?  ? ?. ?Skin Assessment: ?I have examined the patient's skin and I agree with the wound assessment as performed by the wound care RN as outlined below: ?  ? ?Consultants:  ?Psychiatry ? ?Procedures:  ?None ? ?Antimicrobials:  ?Anti-infectives (From admission, onward)  ? ? None  ? ?  ?  ? ? ?Subjective: ?Patient seen earlier today.  When I went to see him the first time, he was severely agitated and was cursing and he was not willing to talk to me so I had to leave.  When I came back, he was being held down by Sports administrator. ? ?Objective: ?Vitals:  ? 09/16/21 2223 09/17/21 0301 09/17/21 9323 09/17/21 0934  ?BP: (!) 135/97 (!) 143/106 (!) 135/93 124/81  ?Pulse: 84 66 73 76  ?Resp: '18 16 19 18  '$ ?Temp: 98.6 ?F (37 ?C) 98.8 ?F (37.1 ?C) 98.2 ?F (36.8 ?C) 98.4 ?F (36.9 ?C)  ?TempSrc: Oral Oral Oral Oral  ?SpO2: 98% 99% 99% 98%  ?Weight:      ?Height:      ? ? ?Intake/Output Summary (Last 24 hours) at 09/17/2021 1108 ?Last data filed at 09/17/2021 0600 ?Gross per 24 hour  ?Intake 1637.53 ml  ?Output --  ?Net 1637.53 ml  ? ?Filed Weights  ? 09/15/21 2225  ?Weight: 62 kg  ? ? ?Examination: ? ?General exam: Appears agitated and aggressive. ?Respiratory system: Clear to auscultation. Respiratory effort normal. ?Cardiovascular system: S1 & S2 heard, RRR. No JVD, murmurs, rubs, gallops or clicks. No pedal edema. ?Gastrointestinal system: Abdomen is nondistended, soft and nontender. No organomegaly or masses felt. Normal bowel sounds heard. ?Central nervous system: Alert and oriented. No focal neurological deficits. ? ? ?Data Reviewed: I have personally reviewed following labs and imaging studies ? ?CBC: ?Recent Labs  ?Lab 09/16/21 ?0051 09/17/21 ?5573  ?WBC 5.4 4.9  ?NEUTROABS 2.8  --   ?HGB 13.1 12.4*  ?HCT 39.2 35.9*  ?MCV 93.3 90.7  ?PLT 191 191  ? ?Basic  Metabolic Panel: ?Recent Labs  ?Lab 09/16/21 ?0051 09/17/21 ?2202  ?NA 137 137  ?K 3.6 4.0  ?CL 99 102  ?CO2 25 23  ?GLUCOSE 110* 96  ?BUN 15 16  ?CREATININE 0.74 0.84  ?CALCIUM 8.6* 8.8*  ? ?GFR: ?Estimated Creatinine Clearance: 95 mL/min (by C-G formula based on SCr of 0.84 mg/dL). ?Liver Function Tests: ?Recent Labs  ?Lab 09/16/21 ?0051  ?AST 16  ?ALT 14  ?ALKPHOS 60  ?BILITOT 0.7  ?PROT 6.3*  ?ALBUMIN 4.0  ? ?Recent Labs  ?Lab 09/16/21 ?0051  ?LIPASE 25  ? ?No results for input(s): AMMONIA in the last 168 hours. ?Coagulation Profile: ?No results for  input(s): INR, PROTIME in the last 168 hours. ?Cardiac Enzymes: ?No results for input(s): CKTOTAL, CKMB, CKMBINDEX, TROPONINI in the last 168 hours. ?BNP (last 3 results) ?No results for input(s): PROBNP in the last 8760 hours. ?HbA1C: ?No results for input(s): HGBA1C in the last 72 hours. ?CBG: ?No results for input(s): GLUCAP in the last 168 hours. ?Lipid Profile: ?No results for input(s): CHOL, HDL, LDLCALC, TRIG, CHOLHDL, LDLDIRECT in the last 72 hours. ?Thyroid Function Tests: ?No results for input(s): TSH, T4TOTAL, FREET4, T3FREE, THYROIDAB in the last 72 hours. ?Anemia Panel: ?No results for input(s): VITAMINB12, FOLATE, FERRITIN, TIBC, IRON, RETICCTPCT in the last 72 hours. ?Sepsis Labs: ?Recent Labs  ?Lab 09/16/21 ?0051  ?LATICACIDVEN 1.2  ? ? ?Recent Results (from the past 240 hour(s))  ?Resp Panel by RT-PCR (Flu A&B, Covid) Nasopharyngeal Swab     Status: None  ? Collection Time: 09/15/21 11:30 PM  ? Specimen: Nasopharyngeal Swab; Nasopharyngeal(NP) swabs in vial transport medium  ?Result Value Ref Range Status  ? SARS Coronavirus 2 by RT PCR NEGATIVE NEGATIVE Final  ?  Comment: (NOTE) ?SARS-CoV-2 target nucleic acids are NOT DETECTED. ? ?The SARS-CoV-2 RNA is generally detectable in upper respiratory ?specimens during the acute phase of infection. The lowest ?concentration of SARS-CoV-2 viral copies this assay can detect is ?138 copies/mL. A negative  result does not preclude SARS-Cov-2 ?infection and should not be used as the sole basis for treatment or ?other patient management decisions. A negative result may occur with  ?improper specimen collecti

## 2021-09-17 NOTE — Progress Notes (Signed)
Initial Nutrition Assessment ? ?DOCUMENTATION CODES:  ? ?Not applicable ? ?INTERVENTION:  ?MVI daily ?Ensure Enlive po TID, each supplement provides 350 kcal and 20 grams of protein. ?Monitor diet advancement  ? ? ?NUTRITION DIAGNOSIS:  ? ?Increased nutrient needs related to cancer and cancer related treatments (pancreatic cancer) as evidenced by estimated needs. ? ? ?GOAL:  ? ?Patient will meet greater than or equal to 90% of their needs ? ? ?MONITOR:  ? ?PO intake, Supplement acceptance, Diet advancement, Labs, Weight trends ? ?REASON FOR ASSESSMENT:  ? ?Consult ?Assessment of nutrition requirement/status (nutritional goals) ? ?ASSESSMENT:  ? ? Pt is a 41 year old male with medical history significant of bipolar d/o, anxiety, PTSD, Hep C, HLD, GERD, recent diagnosis of pancreatic cancer undergoing chemotherapy (chemo started 09/2020) who presented with epigastric/left upper quadrant tenderness and abdominal pain related to pancreatic cancer.  ? ?Per chart review, pt was unable to complete his initial dose of chemotherapy due to intractable pain. IR consulted for celiac plexus block.  ? ?Attempted to visit pt this morning and per sitter, pt was combative this morning and had just fallen asleep and asked dietetic intern to come back and visit pt later since he had just calmed down. Attempted to visit pt a second time and pt was not in room. Per nurse tech, pt had went down to IR.  ? ?Current wt: 62 kg  ?Per weight hx, pt weight was 58.4 kg on 09/03/21 and 57.6 kg on 08/24/21. Per weight hx, pt's weight has been trending upward.  ? ?Labs reviewed and include: Calcium: 8.8 (L), Hemoglobin: 12.4 (L) ? ?Medications reviewed and include:  ? nicotine  14 mg Transdermal Daily  ? pantoprazole  40 mg Oral Daily  ? risperiDONE  0.5 mg Oral QHS  ? ?Continuous Infusions: ? lactated ringers 75 mL/hr at 09/16/21 2304  ? ? ?NUTRITION - FOCUSED PHYSICAL EXAM: unable to perform NFPE at this time. Needs NFPE at follow up  ? ? ?Diet  Order:   ?Diet Order   ? ?       ?  Diet NPO time specified  Diet effective midnight       ?  ? ?  ?  ? ?  ? ? ?EDUCATION NEEDS:  ? ?Not appropriate for education at this time ? ?Skin:  Skin Assessment: Reviewed RN Assessment ? ?Last BM:  unknown ? ?Height:  ? ?Ht Readings from Last 1 Encounters:  ?09/15/21 '5\' 3"'$  (1.6 m)  ? ? ?Weight:  ? ?Wt Readings from Last 1 Encounters:  ?09/15/21 62 kg  ? ? ?Ideal Body Weight:    ? ?BMI:  Body mass index is 24.21 kg/m?. ? ?Estimated Nutritional Needs:  ? ?Kcal:  2100 - 2300 ? ?Protein:  105 - 120 grams ? ?Fluid:  >/= 2.1 L ? ? ? ?Maryruth Hancock, Dietetic Intern ?09/17/2021 4:54 PM ?

## 2021-09-17 NOTE — Procedures (Signed)
Vascular and Interventional Radiology Procedure Note ? ?Patient: Tony Elliott ?DOB: 06-12-1981 ?Medical Record Number: [id number] ?Note Date/Time: 09/17/21 4:35 PM  ? ?Performing Physician: Michaelle Birks, MD ?Assistant(s): None ? ?Diagnosis: Pancreatic mass with intractable abdominal pain. ? ?Procedure: CELIAC PLEXUS NERVE BLOCK ? ?Anesthesia: Conscious Sedation ?Complications: None ?Estimated Blood Loss:  0 mL ?Specimens: Sent for None ? ?Findings:  ?Successful CT-guided celiac plexus nerve block, by a bilateral posterior approach. ?40 mg/mL Kenalog and 0.5% Bupivacaine was administed ?Hemostasis of the tract was achieved using Manual Pressure. ? ?Plan: Bed rest for 2 hours. ? ?See detailed procedure note with images in PACS. ?The patient tolerated the procedure well without incident or complication and was returned to Floor Bed in stable condition.  ? ? ?Michaelle Birks, MD ?[facility] ?[facility] ? ? ?Pager. [phone] ?Clinic. [phone]  ?

## 2021-09-17 NOTE — TOC Progression Note (Signed)
Transition of Care (TOC) - Initial/Assessment Note  ? ? ?Patient Details  ?Name: Tony Elliott ?MRN: [id number] ?Date of Birth: December 08, 1980 ? ?Transition of Care (TOC) CM/SW Contact:    ?Paulene Floor Debbra Digiulio, LCSWA ?Phone Number: ?09/17/2021, 12:55 PM ? ?Clinical Narrative:                 ?IVC paperwork complete.  Patient served with IVC. ? ?  ?  ? ? ?Patient Goals and CMS Choice ?  ?  ?  ? ?Expected Discharge Plan and Services ?  ?  ?  ?  ?  ?                ?  ?  ?  ?  ?  ?  ?  ?  ?  ?  ? ?Prior Living Arrangements/Services ?  ?  ?  ?       ?  ?  ?  ?  ? ?Activities of Daily Living ?Home Assistive Devices/Equipment: None ?ADL Screening (condition at time of admission) ?Patient's cognitive ability adequate to safely complete daily activities?: Yes ?Is the patient deaf or have difficulty hearing?: No ?Does the patient have difficulty seeing, even when wearing glasses/contacts?: No ?Does the patient have difficulty concentrating, remembering, or making decisions?: No ?Patient able to express need for assistance with ADLs?: Yes ?Does the patient have difficulty dressing or bathing?: No ?Independently performs ADLs?: Yes (appropriate for developmental age) ?Does the patient have difficulty walking or climbing stairs?: No ?Weakness of Legs: None ?Weakness of Arms/Hands: None ? ?Permission Sought/Granted ?  ?  ?   ?   ?   ?   ? ?Emotional Assessment ?  ?  ?  ?  ?  ?  ? ?Admission diagnosis:  Malignant neoplasm of body of pancreas ([place]) [C25.1] ?Intractable abdominal pain [R10.9] ?Uncontrolled pain [R52] ?Abdominal pain [R10.9] ?Patient Active Problem List  ? Diagnosis Date Noted  ? Intractable abdominal pain 09/16/2021  ? Pancreatic adenocarcinoma ([place]) 08/24/2021  ? PTSD (post-traumatic stress disorder) 08/23/2021  ? Lymphadenopathy, inguinal 08/23/2021  ? Illiterate 08/23/2021  ? Hepatitis C antibody positive in blood 08/23/2021  ? Constipation 08/23/2021  ? ADHD (attention deficit hyperactivity disorder) 08/23/2021  ?  Pancreatic cancer ([place])   ? Disability, developmental 02/16/2021  ? Subacute pancreatitis 02/14/2021  ? Marijuana abuse 09/15/2016  ? Alcohol abuse 09/15/2016  ? Episodic mood disorder ([place]) 12/26/2014  ? Hypercholesterolemia 12/08/2012  ? Anxiety state 12/08/2012  ? ?PCP:  Imagene Riches, NP ?Pharmacy:   ?[facility] [id number] ?[place] ?[place] [address] ?Phone: [phone] Fax: [phone] ? ? ? ? ?Social Determinants of Health (SDOH) Interventions ?  ? ?Readmission Risk Interventions ?No flowsheet data found. ? ? ?

## 2021-09-18 LAB — BASIC METABOLIC PANEL
Anion gap: 9 (ref 5–15)
BUN: 22 mg/dL — ABNORMAL HIGH (ref 6–20)
CO2: 22 mmol/L (ref 22–32)
Calcium: 9 mg/dL (ref 8.9–10.3)
Chloride: 104 mmol/L (ref 98–111)
Creatinine, Ser: 0.9 mg/dL (ref 0.61–1.24)
GFR, Estimated: >60 mL/min (ref >60)
Glucose, Bld: 113 mg/dL — ABNORMAL HIGH (ref 70–99)
Potassium: 4.2 mmol/L (ref 3.5–5.1)
Sodium: 135 mmol/L (ref 135–145)

## 2021-09-18 LAB — CBC WITH DIFFERENTIAL/PLATELET
Abs Immature Granulocytes: 0.03 10*3/uL (ref 0.00–0.07)
Basophils Absolute: 0 10*3/uL (ref 0.0–0.1)
Basophils Relative: 0 %
Eosinophils Absolute: 0 10*3/uL (ref 0.0–0.5)
Eosinophils Relative: 0 %
HCT: 34.2 % — ABNORMAL LOW (ref 39.0–52.0)
Hemoglobin: 11.7 g/dL — ABNORMAL LOW (ref 13.0–17.0)
Immature Granulocytes: 1 %
Lymphocytes Relative: 23 %
Lymphs Abs: 1.1 10*3/uL (ref 0.7–4.0)
MCH: 31.3 pg (ref 26.0–34.0)
MCHC: 34.2 g/dL (ref 30.0–36.0)
MCV: 91.4 fL (ref 80.0–100.0)
Monocytes Absolute: 0.3 10*3/uL (ref 0.1–1.0)
Monocytes Relative: 6 %
Neutro Abs: 3.3 10*3/uL (ref 1.7–7.7)
Neutrophils Relative %: 70 %
Platelets: 177 10*3/uL (ref 150–400)
RBC: 3.74 MIL/uL — ABNORMAL LOW (ref 4.22–5.81)
RDW: 12.3 % (ref 11.5–15.5)
WBC: 4.7 10*3/uL (ref 4.0–10.5)
nRBC: 0 % (ref 0.0–0.2)

## 2021-09-18 NOTE — Progress Notes (Signed)
Pt denies any pain. He at breakfast and denies nausea/vomiting. He denies pain/tenderness at the puncture sites.  ?Posterior lumbar puncture sites are C/D/I.  ?Pt to follow-up with Dr. Maryelizabeth Kaufmann, IR, in 3 months.  ? ? ? ?Narda Rutherford, AGNP-BC ?09/18/2021, 12:01 PM ? ? ?

## 2021-09-18 NOTE — Discharge Summary (Signed)
PatientPhysician Discharge Summary  ?Tony Elliott ZSW:[id number] DOB: 07-20-1980 DOA: 09/15/2021 ? ?PCP: Imagene Riches, NP ? ?Admit date: 09/15/2021 ?Discharge date: 09/18/2021 ?30 Day Unplanned Readmission Risk Score   ? ?Flowsheet Row ED to Hosp-Admission (Current) from 09/15/2021 in [facility]  ?30 Day Unplanned Readmission Risk Score (%) 19.49 Filed at 09/18/2021 0800  ? ?  ? ? This score is the patient's risk of an unplanned readmission within 30 days of being discharged (0 -100%). The score is based on dignosis, age, lab data, medications, orders, and past utilization.   ?Low:  0-14.9   Medium: 15-21.9   High: 22-29.9   Extreme: 30 and above ? ?  ? ?  ? ? ? ?Admitted From: Home ?Disposition: Home ? ?Recommendations for Outpatient Follow-up:  ?Follow up with PCP in 1-2 weeks ?Please obtain BMP/CBC in one week ?Please follow up with your PCP on the following pending results: ?Unresulted Labs (From admission, onward)  ? ? None  ? ?  ?  ? ? ?Home Health: None ?Equipment/Devices: None ? ?Discharge Condition: Stable ?CODE STATUS: Full code ?Diet recommendation: Cardiac ? ?Subjective: Seen and examined this morning.  He is fully alert and oriented and very well behaving and denied any abdominal pain and was asking when he can be discharged.  Denied any suicidal or homicidal thoughts. ? ?Brief/Interim Summary: Tony Elliott is a 41 y.o. male with medical history significant of bipolar d/o; Hep C; HLD; and pancreatic cancer undergoing chemotherapy presented with uncontrolled pain. He was last seen in the [facility] to prepare for starting chemo on 3.3/  He has stage 1B mucinous adenocarcinoma and is planned for treatment with FOLFIRINOX q14d x 4 cycles.  It appears that he went for his initial chemo session on 3/8 but then signed out AMA to go to the ER for pain control.  He appears to have declined treatment at that time but ultimately stayed for the duration of treatment.  However, he has  been unhappy with his pain medicine regimen.  Dr. Bobby Rumpf then sent referral to IR for cardiac plexus nerve block to help with pain control, he was scheduled to meet with IR on 09/17/2021 for initial consultation and to schedule the procedure in a week after that.  However he came to the ER again due to uncontrolled pain.  His current pain regimen is Fentanyl patches 37 mcg + oxy 20 mg q4h prn.  Patient was admitted under hospitalist service.  He finally underwent celiac plexus nerve block by IR on 09/17/2021 and currently he is pain-free.  He is wanting to go home. ? ?Episodic mood disorder (HCC)/homicidal behavior/homicidal threatening/bipolar disorder: Per notes and reports, he had made a comment that he wants to kill the person who did his biopsy and wants that person to go through the pain that he is going.  He had repeated his comment yesterday morning again when psychiatry was seeing him and his mother also witnessed that.  Psychiatry had placed him on IVC, sitter was already placed from admission.  I myself witnessed aggressive behavior of the patient when the nurse went to give him his IV medications, he slashed everything down, threw away the standing computer system and was very difficult to control even with 3 people, 2 of them were security officers.  He later calmed down.  I had a lengthy discussion with Dr. Lovette Cliche of psychiatry who mentioned that she will inform risk-management about patient's behavior and the risks  and will take care of all the legal matters that may need to be dealt with. ? ?Patient was seen by psychiatrist Dr. Hal Hope today and per her, he denied SI/HI today and states that he does not recall making homicidal statements towards one of his doctors as was documented.  She also spoke to his mother and she denied safety concerns and stated that he makes comments like that only when he is upset. She affirmed he has no access to any guns/firearms.  After all of this, psychiatry  decided to rescind his IVC and cleared him for discharge. ? ?Discharge plan was discussed with patient and/or family member and they verbalized understanding and agreed with it.  ?Discharge Diagnoses:  ?Principal Problem: ?  Intractable abdominal pain ?Active Problems: ?  Pancreatic cancer ([place]) ?  Marijuana abuse ?  Episodic mood disorder ([place]) ? ? ? ?Discharge Instructions ? ? ?Allergies as of 09/18/2021   ? ?   Reactions  ? Ketorolac Tromethamine Other (See Comments)  ? "makes me angry"  ? Ultram [tramadol Hcl] Other (See Comments)  ? "makes me angry"  ? Ketorolac Tromethamine Other (See Comments)  ? "makes me angry" ?"makes me angry"  ? Tramadol Other (See Comments)  ? "makes me angry" ?"makes me angry"  ? ?  ? ?  ?Medication List  ?  ? ?TAKE these medications   ? ?dexamethasone 4 MG tablet ?Commonly known as: DECADRON ?Take 2 tablets (8 mg total) by mouth daily. Start the day after chemotherapy for 3 days. Take with food. ?  ?fentaNYL 12 MCG/HR ?Commonly known as: [place] ?Place 1 patch onto the skin every 3 (three) days. ?  ?ibuprofen 200 MG tablet ?Commonly known as: ADVIL ?Take 200 mg by mouth every 6 (six) hours as needed for headache or moderate pain. ?  ?loperamide 2 MG capsule ?Commonly known as: IMODIUM ?Take 2 at onset of diarrhea, then 1 every 2hrs until 12hr without a BM. May take 2 tab every 4hrs at bedtime. If diarrhea recurs repeat. ?What changed:  ?how much to take ?how to take this ?when to take this ?  ?naloxone 4 MG/0.1ML Liqd nasal spray kit ?Commonly known as: NARCAN ?Place 1 spray into the nose as needed (opoid overdose). ?  ?ondansetron 4 MG tablet ?Commonly known as: ZOFRAN ?Take 1 tablet (4 mg total) by mouth every 4 (four) hours as needed for nausea. ?What changed: Another medication with the same name was changed. Make sure you understand how and when to take each. ?  ?ondansetron 8 MG tablet ?Commonly known as: Zofran ?Take 1 tablet (8 mg total) by mouth 2 (two) times daily as  needed. Start on day 3 after chemotherapy. ?What changed: reasons to take this ?  ?Oxycodone HCl 20 MG Tabs ?Take 1 tablet (20 mg total) by mouth every 4 (four) hours as needed. ?What changed: reasons to take this ?  ?pantoprazole 40 MG tablet ?Commonly known as: PROTONIX ?Take 1 tablet (40 mg total) by mouth daily. ?  ?prochlorperazine 10 MG tablet ?Commonly known as: COMPAZINE ?Take 1 tablet (10 mg total) by mouth every 6 (six) hours as needed for nausea or vomiting. ?  ? ?  ? ? Follow-up Information   ? ? Heide Scales F, NP Follow up in 1 week(s).   ?Contact information: ?[address] ?[place] [address] ?[phone] ? ? ?  ?  ? ?  ?  ? ?  ? ?Allergies  ?Allergen Reactions  ? Ketorolac  Tromethamine Other (See Comments)  ?  "makes me angry"  ? Ultram [Tramadol Hcl] Other (See Comments)  ?  "makes me angry"  ? Ketorolac Tromethamine Other (See Comments)  ?  "makes me angry" ?"makes me angry"  ? Tramadol Other (See Comments)  ?  "makes me angry" ?"makes me angry"  ? ? ?Consultations: Psychiatry and IR ? ? ?Procedures/Studies: ?CT CHEST W CONTRAST ? ?Result Date: 09/02/2021 ?CLINICAL DATA:  Nausea, vomiting. Newly diagnosed pancreatic cancer. EXAM: CT CHEST WITH CONTRAST TECHNIQUE: Multidetector CT imaging of the chest was performed during intravenous contrast administration. RADIATION DOSE REDUCTION: This exam was performed according to the departmental dose-optimization program which includes automated exposure control, adjustment of the mA and/or kV according to patient size and/or use of iterative reconstruction technique. CONTRAST:  67m ISOVUE-300 IOPAMIDOL (ISOVUE-300) INJECTION 61% COMPARISON:  01/20/2021 FINDINGS: Cardiovascular: Heart is normal size. Aorta is normal caliber. Mediastinum/Nodes: No mediastinal, hilar, or axillary adenopathy. Trachea and esophagus are unremarkable. Thyroid unremarkable. Soft tissue in the anterior mediastinum felt to reflect residual thymus. Lungs/Pleura: Lingular nodule on  image 93 measures 4 mm, stable since prior study. Right middle lobe nodule on image 91 measures 5 mm, stable since prior study. Right upper lobe nodule on image 68 measures 3 mm, stable. No effusions. No confl

## 2021-09-18 NOTE — Progress Notes (Signed)
DISCHARGE NOTE HOME ?Tony Elliott to be discharged Home per MD order. Discussed prescriptions and follow up appointments with the patient. Prescriptions given to patient; medication list explained in detail. Patient verbalized understanding. ? ?Skin clean, dry and intact without evidence of skin break down, no evidence of skin tears noted. IV catheter discontinued intact. Site without signs and symptoms of complications. Dressing and pressure applied. Pt denies pain at the site currently. No complaints noted. ? ?Patient free of lines, drains, and wounds.  ? ?An After Visit Summary (AVS) was printed and given to the patient. ?Patient escorted via wheelchair, and discharged home via private auto. ? ?Vira Agar, RN  ?

## 2021-09-18 NOTE — Consult Note (Addendum)
? ?[facility] Followup Face-to-Face Psychiatric Evaluation ? ? ?Service Date: September 18, 2021 ?LOS:  LOS: 2 days  ? ? ?Assessment  ?Tony Elliott is a 41 y.o. male admitted medically for 09/15/2021 10:23 PM for severe pain 2/2 pancreatic cancer. He carries the psychiatric diagnoses of Bipolar (unlikely), Personality Personality disorder, unspecified, anxiety, Polysubstance use and has a past medical history of  pancreatitis, pancreatic cancer (chemo started 09/2020), and Hep .Psychiatry was consulted for concern for adjustment to cancer dx by Karmen Bongo, MD.  ?  ? ?Had seen pt in AM of  3/16 shortly after receiving BZD and opioid pain medications; agreed to come back after lunch. During later visit (around 2 PM) pt refused to speak to psychiatry team as he had not yet had lunch. He did endorse suicidal ideations during periods of peak pain as well as homicidal ideations toward the physician who performed his biopsy (which he sees as the cause of his pain) but refused to discuss this further. Did a thorough chart review on that date of service (see attending attestatoin) which essentially revealed a history of questionable bipolar disorder, definite EtOH and opioid use disorders, and PTSD. Additionally has remote history of charges for communicating threats/assault and had endorsed HI towards family in ?2018 when admitted for detox. Mother appears to have a history of enabling his behavior and has previously pulled over to buy pt beer on the way to detox admissions.  Have not been able to perform a diagnostic interview due to profound irritability; pt got up out of bed and threatened to leave AMA on interview today.  ? ?3/16 On initial evaluation  pt endorsed both SI and HI (towards the doctor who performed his biopsy) in the setting of uncontrolled pain but otherwise refused to speak to the psychiatric team. At that time he had no plans to leave AMA and recommendation was to proceed with  planned procedure/treat pain and perform full reassessment tomorrow.  ? ?3/17: It was decided to IVC patient on the basis of endorsing intent to leave AMA while having just endorsed within the last 3 min (for the 2nd time ) to psychiatric team that he was having HI towards a specific named individual.Per [place] vs [place], have informed Dr. Ardis Hughs of pt's threats given potential for ongoing doctor-patient relationship. No indication to file police report at this time as pt is not imminent risk to others - would potentially need to file in future depending on clinical presentation if discharged. Some concern that substance use disorder is influencing presentation (although he has very real cause for pain), as he is refusing nonopioid medications for cancer. At this point it is not clear that patient will need to go to the psychiatric hospital - unclear if homicidal ideations are stemming from treatable psychiatric disorder (ie bipolar disorder) or from expression of uncontrolled pain in pt with historically poor impulse control and executive dysfunction. Have IVC'd pt during AM due to threats to leave AMA - celiac plexus block is necessary to treat pain to the extent he is able to be compliant with outpatient chemotherapy.  ? ?3/18: patient denies SI/HI. He states that he does not recall making HI statements. Mother denies safety concerns and describes patient acting out when he gets upset. Patient denies access to guns and mother affirms that he has no access to guns/firearms. Patient is not currently an imminent risk to self, others or psychotic. Patient has history of documented intellectual disability and it is  not uncommon for those with intellectual disability to act out or becoming threatening when agitated or have difficulty communicating their needs. Pain appeared to be a major contributory factor on chart review; however, patient denies issues with pain and expresses that celiac plexus block has been helpful  Patient would not have any benefit from inpatient admission as he is not an imminent risk to self or others. ? ? ? ?Diagnoses:  ?Active Hospital problems: ?Principal Problem: ?  Intractable abdominal pain ?Active Problems: ?  Pancreatic cancer ([place]) ?  Marijuana abuse ?  Episodic mood disorder ([place]) ?  ? ? ?Plan  ?## Safety and Observation Level:  ?- Based on my clinical evaluation, I estimate the patient to be at low in the current setting ?- may discontinue safety sitter ?  ?## Medications:  ?-- Seroquel '25mg'$  PRN for anxiety  ? -- had 1 mg ativan q4 h ordered by primary team, this should be used secondary to seroquel ? - would not dc pt with ativan; would likely schedule 1-2 mg IV ativan prior to receiving chemotherapy to maximize chances he receives full infusion ? ?-- Risperidone 1 mg QHS for irritability, agitation.  ?-- Agitation protocol: Haldol '5mg'$  IV PRN q6h with Benadryl '25mg'$  IM for agitation ?  ?  ?## Medical Decision Making Capacity:  ?-- Not formally assessed ?  ?## Further Work-up:  ?-- Per primary ?  ?  ?-- most recent EKG on 3/16 had QtC of 473 ?-- Pertinent labwork reviewed earlier this admission includes: Lipase 25 ?  ?## Disposition:  ?-- Per primary ?-- patient denies access to guns and this has been confirmed with mother (3/18) ? ?## Behavioral / Environmental:  ?-- Verbal reorientation ?  ?##Legal Status ?--  Have filled out IVC paperwork. Unclear what best course forwards is at this time.  ?  ?Thank you for this consult request. Recommendations have been communicated to the primary team.  We will will continue to follow at this time.  ?  ? ?Ival Bible, MD ? ? ? followup history  ?Relevant Aspects of Hospital Course:  ?Admitted on 09/15/2021 for  pain 2/2 pancreatic cancer with plan to have celiac plexus block ? ?Patient Report:  ? ?Chart review notes that patient did have his EUS pancreatic biopsy done by a physician in the [facility] system. Patient is currently in [facility].  ? ?On assessment today patient denies HI and states that he does not recall making such comments. ? ?On approach, patient is laying in bed in NAD. He is AAO x 3.He provides one word answers to most questions asked. He describes his mood as "good". He denies SI/HI/AVH. When asked about making HI comments yesterday, he states that he does not remember making those statements. He acknowledges that he had a procedure yesterday (celiac plexus block) and indicates that pain is well controlled. He denies access to guns. He denies issues with sleep or appetite. He provides consent to reach out to his mother.  ? ?  ? ?Collateral information:  ?Juliann Pulse (mother)  [phone] ?Called at 11:32 AM.  ?When asked about her concerns about homicidal or suicidal statements, she states, "I have no concerns about those at all". She states, " he only makes such comments when someone really really makes him mad and he will act out". Has no access to guns or firearms. States that she would like an update from primary team.  ? ?Psychiatric History:  ?Information collected from EMS ?Polysubstance  hx ?Personality disorder, nos ?Etoh use disorder, hx ?INPT at [facility]: 3/18, 12/2014 ?  ?Family psych history: Defer ?  ?  ?Social History:  ?Illiterate, per EMR ?- Mom appears to be primary caretaker based on EMR ?  ?Tobacco use: Defer ?Alcohol use:  Defer ?Drug use: Defer ? ?Family History:  ? ?The patient's family history includes Diabetes in his mother; Heart disease in his mother; High blood pressure in his mother. ? ?Medical History: ?Past Medical History:  ?Diagnosis Date  ? Anxiety   ? Bipolar disorder ([place])   ? Cancer [facility])   ? Chronic hepatitis C ([place])   ? Depression   ? GERD (gastroesophageal reflux disease)   ? H. pylori infection   ? History of respiratory failure   ? Hypercholesterolemia   ? Intentional drug overdose ([place])   ? with multiple suicidal gestures in the past  ? PTSD (post-traumatic stress disorder)   ?  Thyroid disease   ? ? ?Surgical History: ?Past Surgical History:  ?Procedure Laterality Date  ? ESOPHAGOGASTRODUODENOSCOPY  11/19/2010  ? Normal EGD  ? ESOPHAGOGASTRODUODENOSCOPY (EGD) WITH PROPOFOL N/A 08/19/2021

## 2021-09-18 NOTE — TOC Progression Note (Signed)
Transition of Care (TOC) - Progression Note  ? ? ?Patient Details  ?Name: Tony Elliott ?MRN: [id number] ?Date of Birth: [birth year] ? ?Transition of Care (TOC) CM/SW Contact  ?Carolin Sicks, LCSWA ?Phone Number: ?09/18/2021, 2:48 PM ? ?Clinical Narrative:    ?CSW faxed Commitment Change form to the magistrate office.  CSW will place a copy on the pt's chart.  No further needs noted at this time.  Please consult [place] if other needs arise. ? ? ?  ?  ? ?Expected Discharge Plan and Services ?  ?  ?  ?  ?  ?Expected Discharge Date: 09/18/21               ?  ?  ?  ?  ?  ?  ?  ?  ?  ?  ? ? ?Social Determinants of Health (SDOH) Interventions ?  ? ?Readmission Risk Interventions ?No flowsheet data found. ? ?

## 2021-09-18 NOTE — Plan of Care (Signed)

## 2021-09-19 NOTE — Progress Notes (Incomplete)
?[facility]  ?[address] ?[place],  [place]  [address] ?[phone] ? ?Clinic Day:  09/19/2021 ? ?Referring physician: Imagene Riches, NP ? ? ?HISTORY OF PRESENT ILLNESS:  ?The patient is a 41 y.o. male who I was asked to consult upon for newly diagnosed pancreatic cancer.  His history dates back approximately 8 months ago when he first began having left upper quadrant pain.  Over months, his pain began radiating to his back.  His mother believes he has lost as much as 15 pounds during the same time.  As the symptoms became virtually unbearable, he came to the emergency room for further evaluation in early February 2022.  CT scans done at that time showed a 2.2 cm mass in the body of his pancreas, which was leading to secondary pancreatic ductal dilatation.  Last week, the patient underwent endoscopic ultrasound with biopsy, whose findings revealed adenocarcinoma.  Of note, scans showed no obvious evidence of any metastatic disease.  He comes in today to go over his biopsy and scan results, as well as their implications.  The patient did come to the emergency room over the weekend with severe abdominal pain.  Unlike scans done a few weeks ago, his most recent CT scan showed a fluid collection at the tail of his pancreas that was abutting against his spleen.  The concern was these findings were reflective of another bout of pancreatitis. ? ?PAST MEDICAL HISTORY:  ? ?Past Medical History:  ?Diagnosis Date  ?? Anxiety   ?? Bipolar disorder ([place])   ?? Cancer [facility])   ?? Chronic hepatitis C ([place])   ?? Depression   ?? GERD (gastroesophageal reflux disease)   ?? H. pylori infection   ?? History of respiratory failure   ?? Hypercholesterolemia   ?? Intentional drug overdose ([place])   ? with multiple suicidal gestures in the past  ?? PTSD (post-traumatic stress disorder)   ?? Thyroid disease   ? ? ?PAST SURGICAL HISTORY:  ? ?Past Surgical History:  ?Procedure Laterality Date  ??  ESOPHAGOGASTRODUODENOSCOPY  11/19/2010  ? Normal EGD  ?? ESOPHAGOGASTRODUODENOSCOPY (EGD) WITH PROPOFOL N/A 08/19/2021  ? Procedure: ESOPHAGOGASTRODUODENOSCOPY (EGD) WITH PROPOFOL;  Surgeon: Milus Banister, MD;  Location: [facility];  Service: Endoscopy;  Laterality: N/A;  ?? EUS N/A 08/19/2021  ? Procedure: UPPER ENDOSCOPIC ULTRASOUND (EUS) RADIAL;  Surgeon: Milus Banister, MD;  Location: [facility];  Service: Endoscopy;  Laterality: N/A;  ?? FINE NEEDLE ASPIRATION N/A 08/19/2021  ? Procedure: FINE NEEDLE ASPIRATION (FNA) LINEAR;  Surgeon: Milus Banister, MD;  Location: [facility];  Service: Endoscopy;  Laterality: N/A;  ? ? ?CURRENT MEDICATIONS:  ? ?Current Outpatient Medications  ?Medication Sig Dispense Refill  ?? dexamethasone (DECADRON) 4 MG tablet Take 2 tablets (8 mg total) by mouth daily. Start the day after chemotherapy for 3 days. Take with food. 8 tablet 5  ?? fentaNYL (DURAGESIC) 12 MCG/HR Place 1 patch onto the skin every 3 (three) days. (Patient not taking: Reported on 09/16/2021) 5 patch 0  ?? ibuprofen (ADVIL) 200 MG tablet Take 200 mg by mouth every 6 (six) hours as needed for headache or moderate pain.    ?? loperamide (IMODIUM) 2 MG capsule Take 2 at onset of diarrhea, then 1 every 2hrs until 12hr without a BM. May take 2 tab every 4hrs at bedtime. If diarrhea recurs repeat. (Patient taking differently: Take 2-4 mg by mouth See admin instructions. Take 2 at onset of diarrhea, then  1 every 2hrs until 12hr without a BM. May take 2 tab every 4hrs at bedtime. If diarrhea recurs repeat.) 30 capsule 0  ?? naloxone (NARCAN) nasal spray 4 mg/0.1 mL Place 1 spray into the nose as needed (opoid overdose).    ?? ondansetron (ZOFRAN) 4 MG tablet Take 1 tablet (4 mg total) by mouth every 4 (four) hours as needed for nausea. (Patient not taking: Reported on 09/16/2021) 90 tablet 3  ?? ondansetron (ZOFRAN) 8 MG tablet Take 1 tablet (8 mg total) by mouth 2 (two) times daily as needed. Start on day 3 after  chemotherapy. (Patient taking differently: Take 8 mg by mouth 2 (two) times daily as needed for vomiting or nausea. Start on day 3 after chemotherapy.) 30 tablet 1  ?? Oxycodone HCl 20 MG TABS Take 1 tablet (20 mg total) by mouth every 4 (four) hours as needed. (Patient taking differently: Take 20 mg by mouth every 4 (four) hours as needed (pain).) 90 tablet 0  ?? pantoprazole (PROTONIX) 40 MG tablet Take 1 tablet (40 mg total) by mouth daily. 30 tablet 6  ?? prochlorperazine (COMPAZINE) 10 MG tablet Take 1 tablet (10 mg total) by mouth every 6 (six) hours as needed for nausea or vomiting. 90 tablet 3  ? ?No current facility-administered medications for this visit.  ? ? ?ALLERGIES:  ? ?Allergies  ?Allergen Reactions  ?? Ketorolac Tromethamine Other (See Comments)  ?  "makes me angry"  ?? Ultram [Tramadol Hcl] Other (See Comments)  ?  "makes me angry"  ?? Ketorolac Tromethamine Other (See Comments)  ?  "makes me angry" ?"makes me angry"  ?? Tramadol Other (See Comments)  ?  "makes me angry" ?"makes me angry"  ? ? ?FAMILY HISTORY:  ? ?Family History  ?Problem Relation Age of Onset  ?? Heart disease Mother   ?? High blood pressure Mother   ?? Diabetes Mother   ?? Colon cancer Neg Hx   ?? Pancreatic cancer Neg Hx   ?? Rectal cancer Neg Hx   ?? Stomach cancer Neg Hx   ? ? ?SOCIAL HISTORY:  ?The patient was born in [place]; she currently lives in [place].  He is single, with no children.  He has never worked due to being on disability for multiple psychiatric issues.  Of note, he admits to using crystal methamphetamine 4 times daily he also admits to marijuana use.  There is also a history of other illicit drug use.  ? ?REVIEW OF SYSTEMS:  ?Review of Systems  ?Constitutional:  Positive for fatigue and unexpected weight change. Negative for fever.  ?Respiratory:  Negative for chest tightness, cough, hemoptysis and shortness of breath.   ?Cardiovascular:  Negative for chest pain and palpitations.   ?Gastrointestinal:  Positive for abdominal pain. Negative for abdominal distention, blood in stool, constipation, diarrhea, nausea and vomiting.  ?Genitourinary:  Negative for dysuria, frequency and hematuria.   ?Musculoskeletal:  Negative for arthralgias, back pain and myalgias.  ?Skin:  Negative for itching and rash.  ?Neurological:  Positive for headaches. Negative for dizziness and light-headedness.  ?Psychiatric/Behavioral:  Positive for decreased concentration and depression. Negative for suicidal ideas. The patient is nervous/anxious.    ? ?PHYSICAL EXAM:  ?There were no vitals taken for this visit. ?Wt Readings from Last 3 Encounters:  ?09/15/21 136 lb 11 oz (62 kg)  ?09/03/21 128 lb 11.2 oz (58.4 kg)  ?08/24/21 127 lb (57.6 kg)  ? ?There is no height or weight on file to calculate BMI. ?Performance  status (ECOG): 1 - Symptomatic but completely ambulatory ?Physical Exam ?Constitutional:   ?   Appearance: Normal appearance. He is not ill-appearing.  ?   Comments: He appears to be in significant pain; unkempt  ?HENT:  ?   Mouth/Throat:  ?   Mouth: Mucous membranes are moist.  ?   Pharynx: Oropharynx is clear. No oropharyngeal exudate or posterior oropharyngeal erythema.  ?Cardiovascular:  ?   Rate and Rhythm: Normal rate and regular rhythm.  ?   Heart sounds: No murmur heard. ?  No friction rub. No gallop.  ?Pulmonary:  ?   Effort: Pulmonary effort is normal. No respiratory distress.  ?   Breath sounds: Normal breath sounds. No wheezing, rhonchi or rales.  ?Abdominal:  ?   General: Bowel sounds are normal. There is no distension.  ?   Palpations: Abdomen is soft. There is no mass.  ?   Tenderness: There is no abdominal tenderness.  ?Musculoskeletal:     ?   General: No swelling.  ?   Right lower leg: No edema.  ?   Left lower leg: No edema.  ?Lymphadenopathy:  ?   Cervical: No cervical adenopathy.  ?   Upper Body:  ?   Right upper body: No supraclavicular or axillary adenopathy.  ?   Left upper body: No  supraclavicular or axillary adenopathy.  ?   Lower Body: No right inguinal adenopathy. No left inguinal adenopathy.  ?Skin: ?   General: Skin is warm.  ?   Coloration: Skin is not jaundiced.  ?   Findings: No lesion or rash.  ?Neurol

## 2021-09-20 ENCOUNTER — Ambulatory Visit: Payer: Medicare Other | Admitting: Oncology

## 2021-09-20 ENCOUNTER — Other Ambulatory Visit: Payer: Medicare Other

## 2021-09-22 ENCOUNTER — Ambulatory Visit: Payer: Medicare Other

## 2021-09-23 ENCOUNTER — Other Ambulatory Visit: Payer: Self-pay | Admitting: Hematology and Oncology

## 2021-09-23 MED ORDER — OXYCODONE HCL 20 MG PO TABS: 20.0000 mg | ORAL_TABLET | ORAL | 0 refills | Status: DC | PRN

## 2021-09-24 ENCOUNTER — Encounter: Payer: Self-pay | Admitting: Oncology

## 2021-10-04 ENCOUNTER — Encounter: Payer: Self-pay | Admitting: Oncology

## 2021-10-06 ENCOUNTER — Encounter: Payer: Self-pay | Admitting: Oncology

## 2021-10-27 ENCOUNTER — Emergency Department (HOSPITAL_COMMUNITY): Payer: Medicare Other

## 2021-10-27 ENCOUNTER — Encounter (HOSPITAL_COMMUNITY): Payer: Self-pay

## 2021-10-27 ENCOUNTER — Emergency Department (HOSPITAL_COMMUNITY)
Admission: EM | Admit: 2021-10-27 | Discharge: 2021-10-27 | Disposition: A | Discharge status: Home / Self Care | Payer: Medicare Other | Attending: Emergency Medicine | Admitting: Emergency Medicine

## 2021-10-27 DIAGNOSIS — R109 Unspecified abdominal pain: Secondary | ICD-10-CM | POA: Diagnosis not present

## 2021-10-27 DIAGNOSIS — Z8507 Personal history of malignant neoplasm of pancreas: Secondary | ICD-10-CM | POA: Insufficient documentation

## 2021-10-27 DIAGNOSIS — M549 Dorsalgia, unspecified: Secondary | ICD-10-CM | POA: Diagnosis not present

## 2021-10-27 LAB — COMPREHENSIVE METABOLIC PANEL
ALT: 16 U/L (ref 0–44)
AST: 17 U/L (ref 15–41)
Albumin: 4 g/dL (ref 3.5–5.0)
Alkaline Phosphatase: 72 U/L (ref 38–126)
Anion gap: 9 (ref 5–15)
BUN: 12 mg/dL (ref 6–20)
CO2: 26 mmol/L (ref 22–32)
Calcium: 9.3 mg/dL (ref 8.9–10.3)
Chloride: 100 mmol/L (ref 98–111)
Creatinine, Ser: 0.81 mg/dL (ref 0.61–1.24)
GFR, Estimated: >60 mL/min (ref >60)
Glucose, Bld: 141 mg/dL — ABNORMAL HIGH (ref 70–99)
Potassium: 3.7 mmol/L (ref 3.5–5.1)
Sodium: 135 mmol/L (ref 135–145)
Total Bilirubin: 0.7 mg/dL (ref 0.3–1.2)
Total Protein: 6.9 g/dL (ref 6.5–8.1)

## 2021-10-27 LAB — CBC WITH DIFFERENTIAL/PLATELET
Abs Immature Granulocytes: 0.01 10*3/uL (ref 0.00–0.07)
Basophils Absolute: 0 10*3/uL (ref 0.0–0.1)
Basophils Relative: 0 %
Eosinophils Absolute: 0 10*3/uL (ref 0.0–0.5)
Eosinophils Relative: 1 %
HCT: 41.6 % (ref 39.0–52.0)
Hemoglobin: 14.1 g/dL (ref 13.0–17.0)
Immature Granulocytes: 0 %
Lymphocytes Relative: 29 %
Lymphs Abs: 1.4 10*3/uL (ref 0.7–4.0)
MCH: 31.1 pg (ref 26.0–34.0)
MCHC: 33.9 g/dL (ref 30.0–36.0)
MCV: 91.6 fL (ref 80.0–100.0)
Monocytes Absolute: 0.3 10*3/uL (ref 0.1–1.0)
Monocytes Relative: 7 %
Neutro Abs: 3.1 10*3/uL (ref 1.7–7.7)
Neutrophils Relative %: 63 %
Platelets: 195 10*3/uL (ref 150–400)
RBC: 4.54 MIL/uL (ref 4.22–5.81)
RDW: 12 % (ref 11.5–15.5)
WBC: 4.8 10*3/uL (ref 4.0–10.5)
nRBC: 0 % (ref 0.0–0.2)

## 2021-10-27 LAB — LIPASE, BLOOD: Lipase: 27 U/L (ref 11–51)

## 2021-10-27 MED ORDER — HYDROMORPHONE HCL 1 MG/ML IJ SOLN
1.0000 mg | Freq: Once | INTRAMUSCULAR | Status: AC
  Administered 2021-10-27: 1 mg via INTRAVENOUS
  Filled 2021-10-27: qty 1

## 2021-10-27 MED ORDER — ONDANSETRON HCL 4 MG/2ML IJ SOLN
4.0000 mg | Freq: Once | INTRAMUSCULAR | Status: AC
Start: 2021-10-27 — End: 2021-10-27
  Administered 2021-10-27: 4 mg via INTRAVENOUS
  Filled 2021-10-27: qty 2

## 2021-10-27 MED ORDER — SODIUM CHLORIDE 0.9 % IV BOLUS
1000.0000 mL | Freq: Once | INTRAVENOUS | Status: AC
  Administered 2021-10-27: 1000 mL via INTRAVENOUS

## 2021-10-27 MED ORDER — MORPHINE SULFATE (CONCENTRATE) 10 MG/0.5ML PO SOLN
20.0000 mg | Freq: Once | ORAL | Status: AC
  Administered 2021-10-27: 20 mg via ORAL
  Filled 2021-10-27 (×2): qty 1

## 2021-10-27 MED ORDER — KETAMINE HCL 50 MG/5ML IJ SOSY
0.3000 mg/kg | PREFILLED_SYRINGE | Freq: Once | INTRAMUSCULAR | Status: AC
  Administered 2021-10-27: 16 mg via INTRAVENOUS
  Filled 2021-10-27: qty 5

## 2021-10-27 NOTE — ED Notes (Signed)
CT called to begin imaging, pt just received more pain med, pt ready ?

## 2021-10-27 NOTE — Discharge Planning (Signed)
RNCM notified [facility] to meet pt at home today after discharge. ?

## 2021-10-27 NOTE — ED Notes (Signed)
EDP at [facility] 

## 2021-10-27 NOTE — ED Provider Notes (Signed)
?[facility] ?Provider Note ? ? ?CSN: [id number] ?Arrival date & time: 10/27/21  1053 ? ?  ? ?History ? ?Chief Complaint  ?Patient presents with  ? Abdominal Pain  ? ? ?Tony Elliott is a 41 y.o. male. ? ?Patient here with ongoing abdominal pain, back pain.  History of pancreatic cancer having some chemotherapy treatments that he states have been very difficult.  He has Dilaudid and morphine prescription at home but pain worse this morning.  Denies any fevers or chills.  Denies any nausea or vomiting.  History of polysubstance abuse.  Overall got fentanyl with EMS with some improvement.  Denies any pain with urination. ? ?The history is provided by the patient.  ? ?  ? ?Home Medications ?Prior to Admission medications   ?Medication Sig Start Date End Date Taking? Authorizing Provider  ?HYDROmorphone (DILAUDID) 2 MG tablet Take 2 mg by mouth every 4 (four) hours as needed for severe pain. 10/22/21  Yes [provider]  ?morphine (MS CONTIN) 30 MG 12 hr tablet Take 30 mg by mouth every 12 (twelve) hours. 10/22/21  Yes [provider]  ?Morphine Sulfate (MORPHINE CONCENTRATE) 10 mg / 0.5 ml concentrated solution Take 20 mg by mouth every 2 (two) hours as needed for severe pain. 10/22/21  Yes [provider]  ?naloxone (NARCAN) nasal spray 4 mg/0.1 mL Place 1 spray into the nose as needed (opoid overdose). 06/29/21  Yes [provider]  ?dexamethasone (DECADRON) 4 MG tablet Take 2 tablets (8 mg total) by mouth daily. Start the day after chemotherapy for 3 days. Take with food. ?Patient not taking: Reported on 10/27/2021 09/03/21   Marice Potter, MD  ?fentaNYL (DURAGESIC) 12 MCG/HR Place 1 patch onto the skin every 3 (three) days. ?Patient not taking: Reported on 09/16/2021 09/03/21   Dayton Scrape A, NP  ?loperamide (IMODIUM) 2 MG capsule Take 2 at onset of diarrhea, then 1 every 2hrs until 12hr without a BM. May take 2 tab every 4hrs at bedtime.  If diarrhea recurs repeat. ?Patient not taking: Reported on 10/27/2021 09/03/21   Marice Potter, MD  ?ondansetron (ZOFRAN) 4 MG tablet Take 1 tablet (4 mg total) by mouth every 4 (four) hours as needed for nausea. ?Patient not taking: Reported on 09/16/2021 09/01/21   Dayton Scrape A, NP  ?ondansetron (ZOFRAN) 8 MG tablet Take 1 tablet (8 mg total) by mouth 2 (two) times daily as needed. Start on day 3 after chemotherapy. ?Patient not taking: Reported on 10/27/2021 09/03/21   Marice Potter, MD  ?Oxycodone HCl 20 MG TABS Take 1 tablet (20 mg total) by mouth every 4 (four) hours as needed. ?Patient not taking: Reported on 10/27/2021 09/23/21   Dayton Scrape A, NP  ?pantoprazole (PROTONIX) 40 MG tablet Take 1 tablet (40 mg total) by mouth daily. ?Patient not taking: Reported on 10/27/2021 05/26/21   Jackquline Denmark, MD  ?prochlorperazine (COMPAZINE) 10 MG tablet Take 1 tablet (10 mg total) by mouth every 6 (six) hours as needed for nausea or vomiting. ?Patient not taking: Reported on 10/27/2021 09/01/21   Melodye Ped, NP  ?   ? ?Allergies    ?Ketorolac tromethamine and Ultram [tramadol hcl]   ? ?Review of Systems   ?Review of Systems ? ?Physical Exam ?Updated Vital Signs ?BP (!) 164/110   Pulse 84   Temp 98.6 ?F (37 ?C) (Oral)   Resp 14   Ht '5\' 3"'$  (1.6 m)   Wt 54.4  kg   SpO2 99%   BMI 21.26 kg/m?  ?Physical Exam ?Vitals and nursing note reviewed.  ?Constitutional:   ?   General: He is in acute distress.  ?   Appearance: He is well-developed. He is not ill-appearing.  ?HENT:  ?   Head: Normocephalic and atraumatic.  ?Eyes:  ?   Conjunctiva/sclera: Conjunctivae normal.  ?Cardiovascular:  ?   Rate and Rhythm: Normal rate and regular rhythm.  ?   Heart sounds: Normal heart sounds. No murmur heard. ?Pulmonary:  ?   Effort: Pulmonary effort is normal. No respiratory distress.  ?   Breath sounds: Normal breath sounds.  ?Abdominal:  ?   Palpations: Abdomen is soft.  ?   Tenderness: There is abdominal tenderness.   ?Musculoskeletal:     ?   General: No swelling.  ?   Cervical back: Neck supple.  ?Skin: ?   General: Skin is warm and dry.  ?   Capillary Refill: Capillary refill takes less than 2 seconds.  ?Neurological:  ?   General: No focal deficit present.  ?   Mental Status: He is alert.  ?Psychiatric:     ?   Mood and Affect: Mood normal.  ? ? ?ED Results / Procedures / Treatments   ?Labs ?(all labs ordered are listed, but only abnormal results are displayed) ?Labs Reviewed  ?COMPREHENSIVE METABOLIC PANEL - Abnormal; Notable for the following components:  ?    Result Value  ? Glucose, Bld 141 (*)   ? All other components within normal limits  ?CBC WITH DIFFERENTIAL/PLATELET  ?LIPASE, BLOOD  ? ? ?EKG ?None ? ?Radiology ?CT Renal Stone Study ? ?Result Date: 10/27/2021 ?CLINICAL DATA:  Abdominal pain in a patient with history of pancreatic cancer. * Tracking Code: BO * EXAM: CT ABDOMEN AND PELVIS WITHOUT CONTRAST TECHNIQUE: Multidetector CT imaging of the abdomen and pelvis was performed following the standard protocol without IV contrast. RADIATION DOSE REDUCTION: This exam was performed according to the departmental dose-optimization program which includes automated exposure control, adjustment of the mA and/or kV according to patient size and/or use of iterative reconstruction technique. COMPARISON:  September 16, 2021. FINDINGS: Lower chest: Incidental imaging of the lung bases without signs of effusion or evidence of consolidative changes. Small RIGHT middle lobe pulmonary nodules are unchanged largest approximately 6 mm. No pericardial effusion to the extent evaluated. Lingular nodule also stable to 5 mm. Hepatobiliary: Noncontrast appearance of the liver grossly unremarkable. No pericholecystic stranding or gross biliary duct distension. Pancreas: Expansion of pancreatic body and tail with atrophy of the distal tail, masslike area measuring 6.2 x 3.8 cm may be increased in size or with increased appearance of size secondary  to the surrounding inflammation. Stranding and soft tissue surrounds the celiac and SMA origins denser changes around the celiac than the SMA on the current study. Central area of low attenuation in the pancreatic tail measuring 19 mm previously 24 mm. Spleen: Normal. Adrenals/Urinary Tract: Stranding extends to the LEFT adrenal gland. This is unchanged. RIGHT adrenal gland is normal. Kidneys with smooth contours. No hydronephrosis or perinephric stranding. Urinary bladder is moderately distended without signs of adjacent stranding. Stomach/Bowel: Abundant stool throughout the colon no signs of small bowel obstruction or acute small bowel process. Appendix not visualized though no secondary signs to suggest acute appendicitis. Vascular/Lymphatic: No discrete adenopathy in the retroperitoneum. LEFT-sided inferior vena cava. Limited assessment of vascular structures due to lack of intravenous contrast. No pelvic adenopathy. Reproductive: Unremarkable by CT. Other:  No ascites or pneumoperitoneum. Musculoskeletal: Osteopenia.  No acute or destructive bone process. IMPRESSION: 1. Expansion of pancreatic body and tail with atrophy of the distal tail, masslike area may be increased in size though lower attenuation focus is similar to decreased size. Findings may reflect a combination of worsening of pancreatic neoplasm and superimposed pancreatitis. 2. Small pulmonary nodules in the chest are unchanged, dating back to July 2022, attention on follow-up. 3. Large volume stool in the colon.  Query constipation. Electronically Signed   By: Zetta Bills M.D.   On: 10/27/2021 14:01   ? ?Procedures ?Procedures  ? ? ?Medications Ordered in ED ?Medications  ?HYDROmorphone (DILAUDID) injection 1 mg (1 mg Intravenous Given 10/27/21 1119)  ?ondansetron [facility]) injection 4 mg (4 mg Intravenous Given 10/27/21 1119)  ?sodium chloride 0.9 % bolus 1,000 mL (0 mLs Intravenous Stopped 10/27/21 1211)  ?HYDROmorphone (DILAUDID) injection 1 mg  (1 mg Intravenous Given 10/27/21 1211)  ?morphine CONCENTRATE 10 MG/0.5ML oral solution 20 mg (20 mg Oral Given 10/27/21 1333)  ?ketamine 50 mg in normal saline 5 mL (10 mg/mL) syringe (16 mg Intravenous Given 4

## 2021-10-27 NOTE — ED Notes (Signed)
Back from CT, denies pain, alert, NAD, calm, interactive, mother at [facility].  ?

## 2021-10-27 NOTE — Progress Notes (Signed)
Transition of Care [facility]) - Emergency Department Mini Assessment ? ? ?Patient Details  ?Name: Tony Elliott ?MRN: [id number] ?Date of Birth: [birth year] ? ?Transition of Care (TOC) CM/SW Contact:    ?Fuller Mandril, RN ?Phone Number: ?10/27/2021, 1:58 PM ? ? ?Clinical Narrative: ?RNCM consulted regarding disposition and home hospice care.  Pt active with [facility].  RNCM contacted Home Hospice Rep, Esther Hardy to notify of pt arrival to ED and discharge planning.  Tim will visit pt and family at bedside within the hour. ? ? ?ED Mini Assessment: ?What brought you to the Emergency Department? : (P) pain control, sent by hospice ? ?Barriers to Discharge: (P) Other (must enter comment) ? ?Barrier interventions: (P) [place] Rep, Esther Hardy ? ?  ? ?Interventions which prevented an admission or readmission: (P) Hospice ? ? ? ?Patient Contact and Communications ?  ?  ?  ? ,     ?  ?  ? ?Patient states their goals for this hospitalization and ongoing recovery are:: (P) pain control ?  ?  ? ?Admission diagnosis:  pain ?Patient Active Problem List  ? Diagnosis Date Noted  ? Intractable abdominal pain 09/16/2021  ? Pancreatic adenocarcinoma ([place]) 08/24/2021  ? PTSD (post-traumatic stress disorder) 08/23/2021  ? Lymphadenopathy, inguinal 08/23/2021  ? Illiterate 08/23/2021  ? Hepatitis C antibody positive in blood 08/23/2021  ? Constipation 08/23/2021  ? ADHD (attention deficit hyperactivity disorder) 08/23/2021  ? Pancreatic cancer ([place])   ? Disability, developmental 02/16/2021  ? Subacute pancreatitis 02/14/2021  ? Marijuana abuse 09/15/2016  ? Alcohol abuse 09/15/2016  ? Episodic mood disorder ([place]) 12/26/2014  ? Hypercholesterolemia 12/08/2012  ? Anxiety state 12/08/2012  ? ?PCP:  Imagene Riches, NP ?Pharmacy:   ?[facility] [id number], [place] ?[address] ?[place] [address] ?Phone: [phone] Fax:  [phone] ? ? ?

## 2021-10-27 NOTE — ED Triage Notes (Signed)
Pt bib ems from home; hx pancreatic cancer, end stage; not doing treatments, hospice pt; c/o back pain, began in back, not generalized; BP 160/120, tachy 110, 130, 98% RA, RR 30; 20 RAC;  pt given 50 mcg fentanyl PTA ?

## 2021-10-27 NOTE — ED Notes (Signed)
Pain meds given. Pt to CT. SW speaking with family.  ?

## 2021-10-27 NOTE — ED Notes (Signed)
Delay in imaging, pt "refused until he gets more pain med" ?

## 2022-01-26 ENCOUNTER — Other Ambulatory Visit: Payer: Self-pay

## 2022-02-23 ENCOUNTER — Other Ambulatory Visit: Payer: Self-pay

## 2022-02-23 ENCOUNTER — Encounter: Payer: Self-pay | Admitting: Oncology

## 2022-02-25 ENCOUNTER — Other Ambulatory Visit: Payer: Self-pay

## 2022-04-05 ENCOUNTER — Encounter: Payer: Self-pay | Admitting: Oncology

## 2022-05-10 IMAGING — CT CT RENAL STONE PROTOCOL
2 of 4 series · 15 of 46 positions shown, 17 images · non-contrast
Comparison: September 16, 2021.

CLINICAL DATA: Abdominal pain in a patient with history of
pancreatic cancer.

* Tracking Code: BO *
EXAM:
CT ABDOMEN AND PELVIS WITHOUT CONTRAST
TECHNIQUE: Multidetector CT imaging of the abdomen and pelvis was performed
following the standard protocol without IV contrast.
RADIATION DOSE REDUCTION: This exam was performed according to the
departmental dose-optimization program which includes automated
exposure control, adjustment of the mA and/or kV according to
patient size and/or use of iterative reconstruction technique.

[Series 3: stone study 5.0 i30f 2 · axial · 0.65mm/px · z∈[+821,+1201]mm · 12 of 87 slices shown, 14 images]
[im 7/87  soft-tissue]
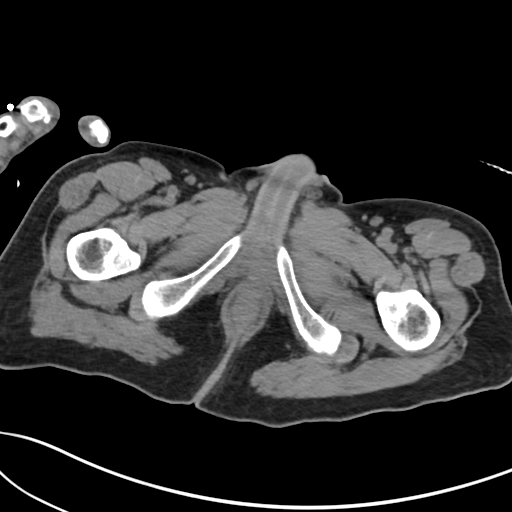
[im 7/87  bone]
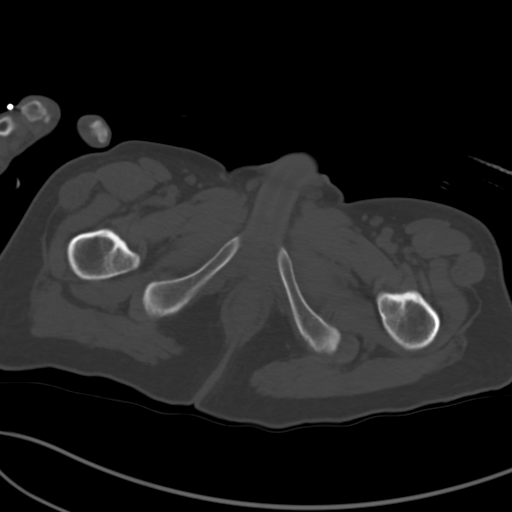
[im 14/87  soft-tissue]
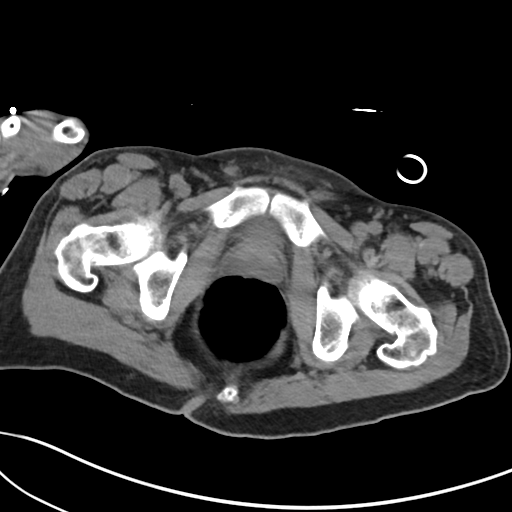
[im 21/87  soft-tissue]
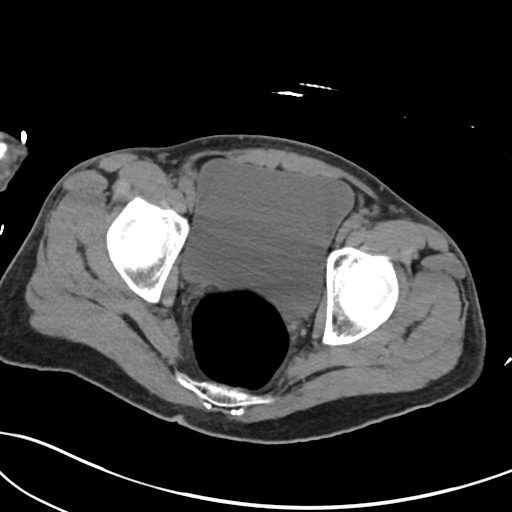
[im 28/87  soft-tissue]
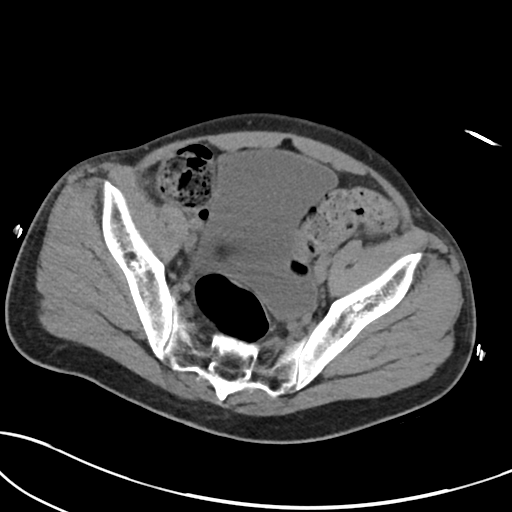
[im 35/87  soft-tissue]
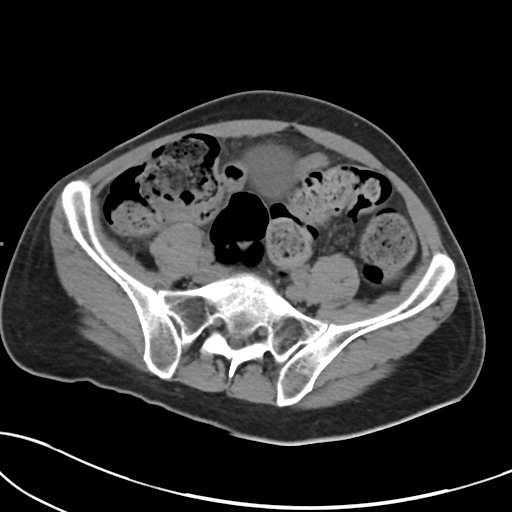
[im 42/87  soft-tissue]
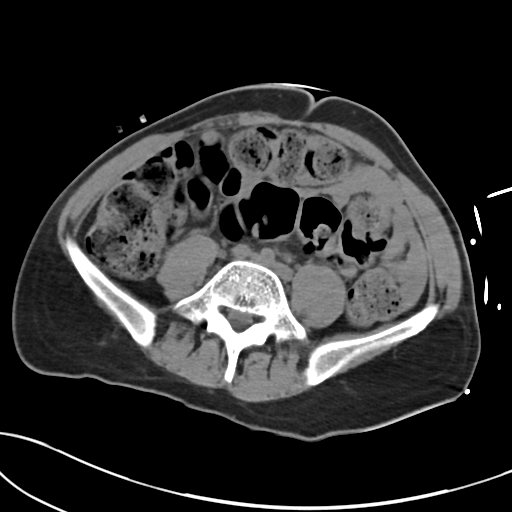
[im 49/87  soft-tissue]
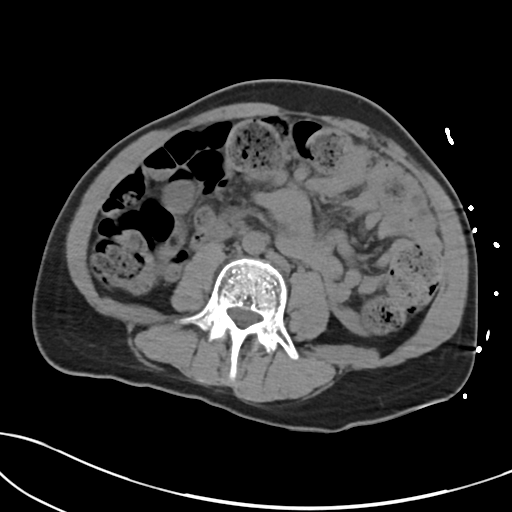
[im 56/87  soft-tissue]
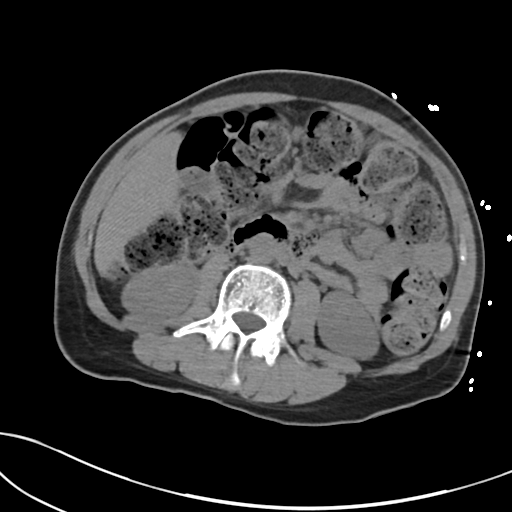
[im 62/87  soft-tissue]
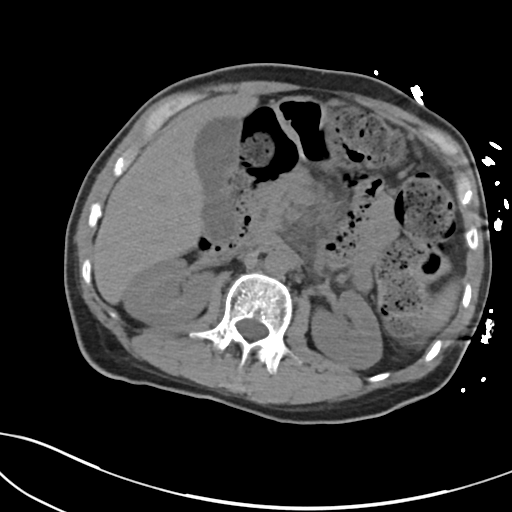
[im 62/87  bone]
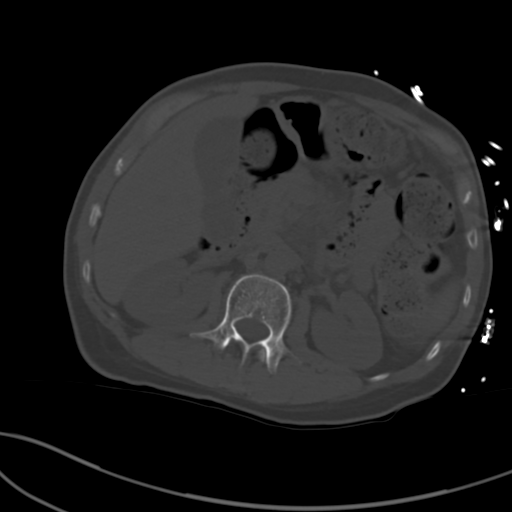
[im 69/87  soft-tissue]
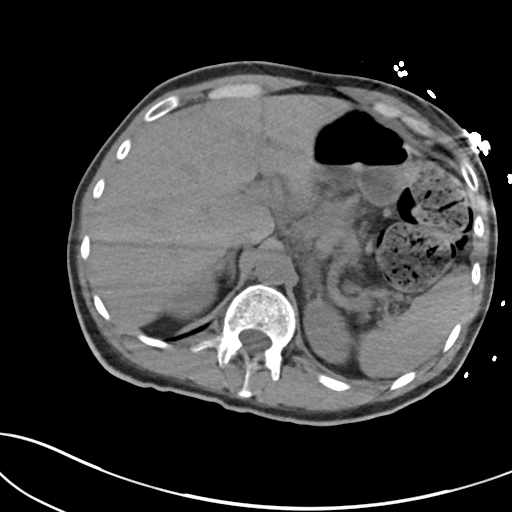
[im 76/87  soft-tissue]
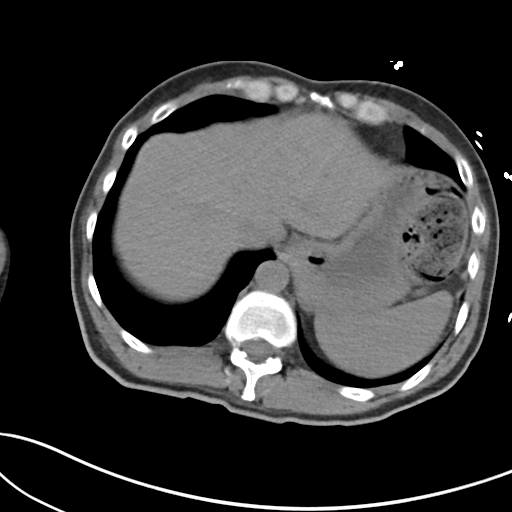
[im 83/87  soft-tissue]
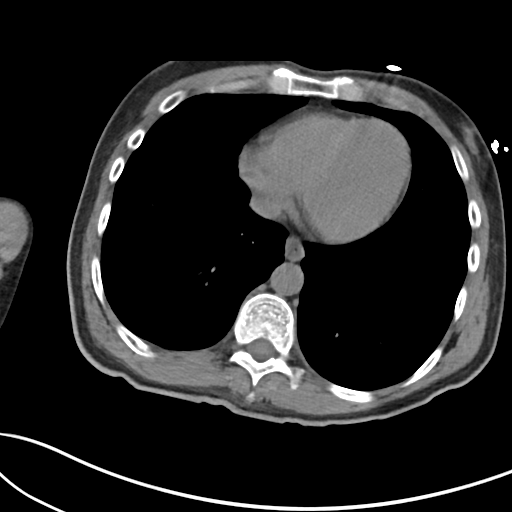

[Series 6: coronal soft tissue · coronal · 0.74mm/px · 3 of 101 slices shown]
[im 34/101  soft-tissue]
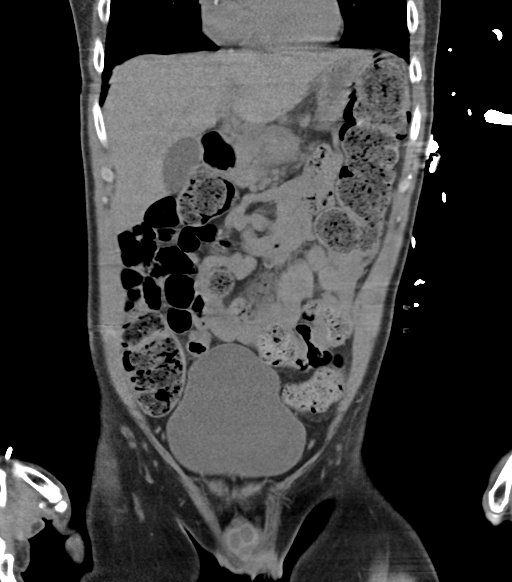
[im 45/101  soft-tissue]
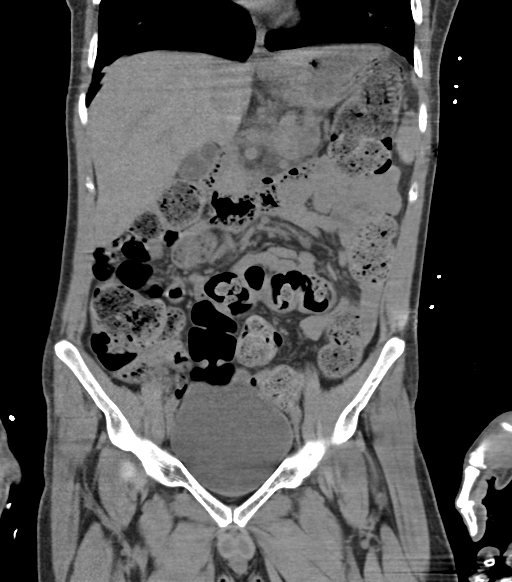
[im 56/101  soft-tissue]
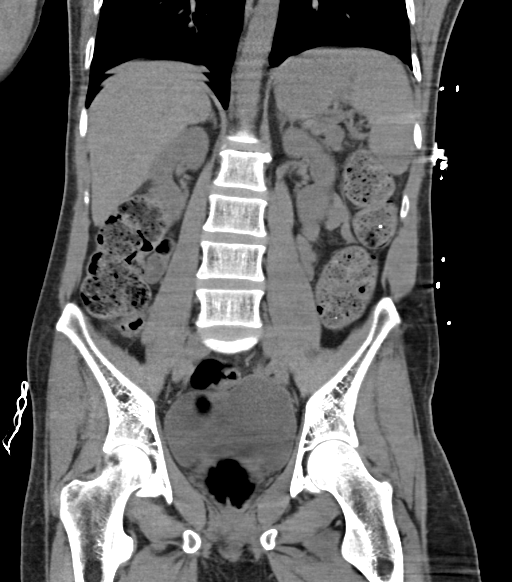

[15 of 46 positions shown; findings below may reference images not displayed]

FINDINGS: Lower chest: Incidental imaging of the lung bases without signs of
effusion or evidence of consolidative changes.

Small RIGHT middle lobe pulmonary nodules are unchanged largest
approximately 6 mm. No pericardial effusion to the extent evaluated.
Lingular nodule also stable to 5 mm.

Hepatobiliary: Noncontrast appearance of the liver grossly
unremarkable. No pericholecystic stranding or gross biliary duct
distension.

Pancreas: Expansion of pancreatic body and tail with atrophy of the
distal tail, masslike area measuring 6.2 x 3.8 cm may be increased
in size or with increased appearance of size secondary to the
surrounding inflammation. Stranding and soft tissue surrounds the
celiac and SMA origins denser changes around the celiac than the SMA
on the current study. Central area of low attenuation in the
pancreatic tail measuring 19 mm previously 24 mm.

Spleen: Normal.

Adrenals/Urinary Tract: Stranding extends to the LEFT adrenal gland.
This is unchanged. RIGHT adrenal gland is normal. Kidneys with
smooth contours. No hydronephrosis or perinephric stranding. Urinary
bladder is moderately distended without signs of adjacent
stranding.

Stomach/Bowel: Abundant stool throughout the colon no signs of small
bowel obstruction or acute small bowel process. Appendix not
visualized though no secondary signs to suggest acute appendicitis.

Vascular/Lymphatic: No discrete adenopathy in the retroperitoneum.
LEFT-sided inferior vena cava. Limited assessment of vascular
structures due to lack of intravenous contrast. No pelvic
adenopathy.

Reproductive: Unremarkable by CT.

Other: No ascites or pneumoperitoneum.

Musculoskeletal: Osteopenia.  No acute or destructive bone process.
IMPRESSION: 1. Expansion of pancreatic body and tail with atrophy of the distal
tail, masslike area may be increased in size though lower
attenuation focus is similar to decreased size. Findings may reflect
a combination of worsening of pancreatic neoplasm and superimposed
pancreatitis.
2. Small pulmonary nodules in the chest are unchanged, dating back
to January 2021, attention on follow-up.
3. Large volume stool in the colon.  Query constipation.

## 2022-05-12 ENCOUNTER — Encounter: Payer: Self-pay | Admitting: Oncology

## 2022-05-13 ENCOUNTER — Encounter: Payer: Self-pay | Admitting: Oncology

## 2022-05-13 NOTE — Telephone Encounter (Signed)
No additional notes

## 2022-08-04 DEATH — deceased

## 2022-11-10 IMAGING — CT CT ABD-PELV W/ CM
2 of 4 series · 14 of 46 positions shown, 16 images · IV contrast (Omni 300)
Comparison: Prior CT of the abdomen and pelvis on 08/22/2021
Rhizza Lejano and 08/18/2021 at [HOSPITAL]

CLINICAL DATA: Nausea and vomiting. Recent endoscopic ultrasound on
08/19/2021 for pancreatic mass with cytology revealing malignant
cells consistent with adenocarcinoma.

EXAM:
CT ABDOMEN AND PELVIS WITH CONTRAST
TECHNIQUE: Multidetector CT imaging of the abdomen and pelvis was performed
using the standard protocol following bolus administration of
intravenous contrast.

[Series 3: a/p w/ 5mm · axial · 0.72mm/px · z∈[+850,+1205]mm · 11 of 83 slices shown, 13 images]
[im 8/83  soft-tissue]
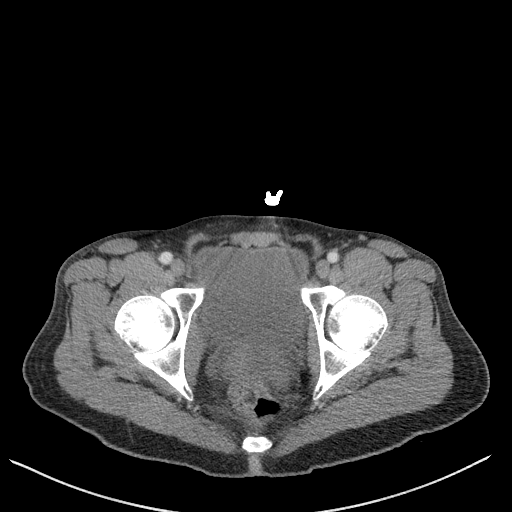
[im 8/83  bone]
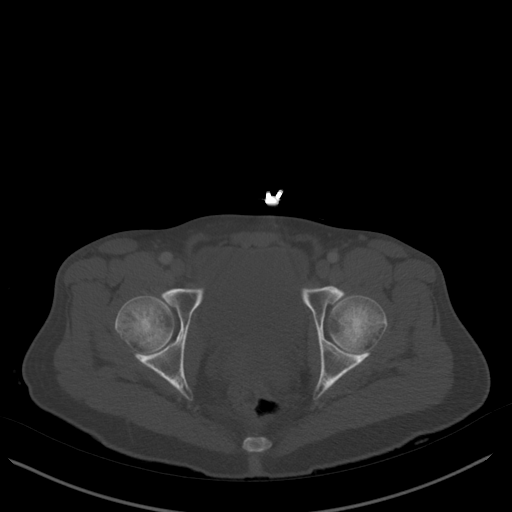
[im 15/83  soft-tissue]
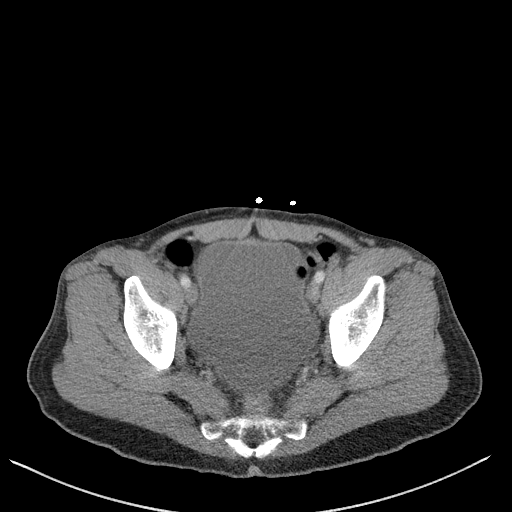
[im 22/83  soft-tissue]
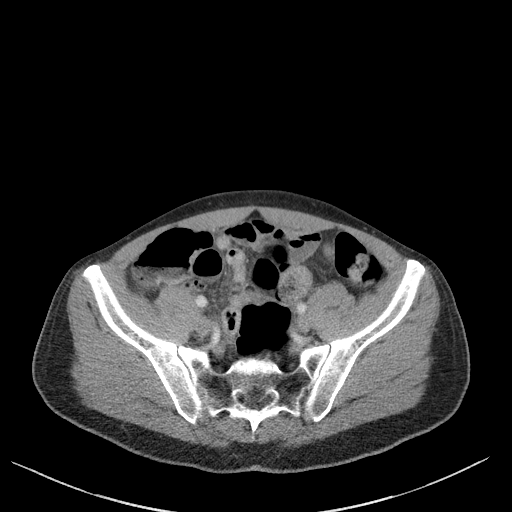
[im 29/83  soft-tissue]
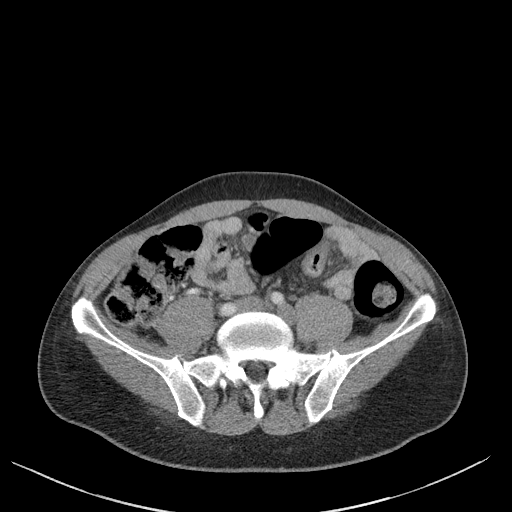
[im 36/83  soft-tissue]
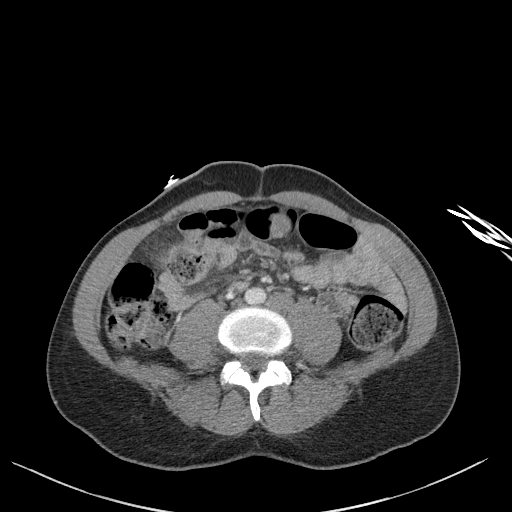
[im 43/83  soft-tissue]
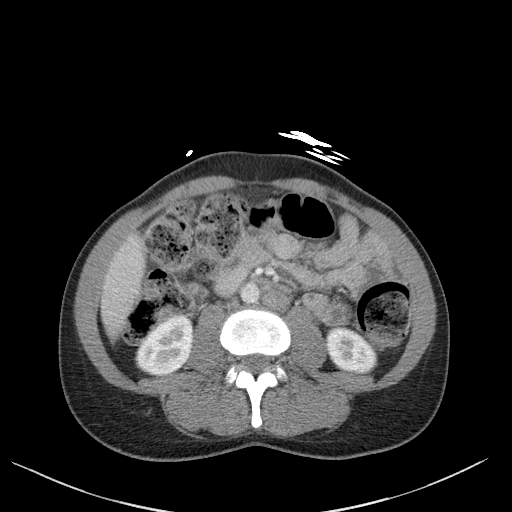
[im 50/83  soft-tissue]
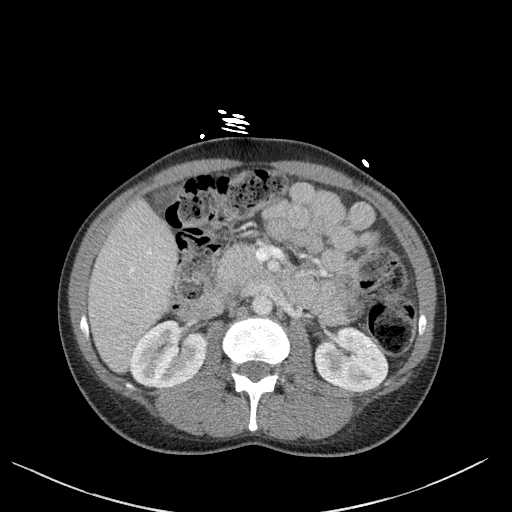
[im 58/83  soft-tissue]
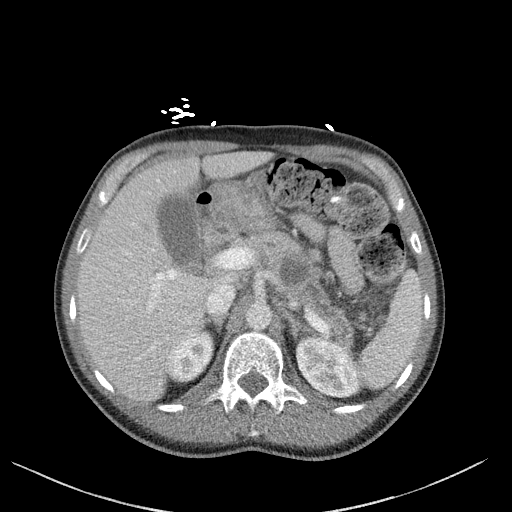
[im 65/83  soft-tissue]
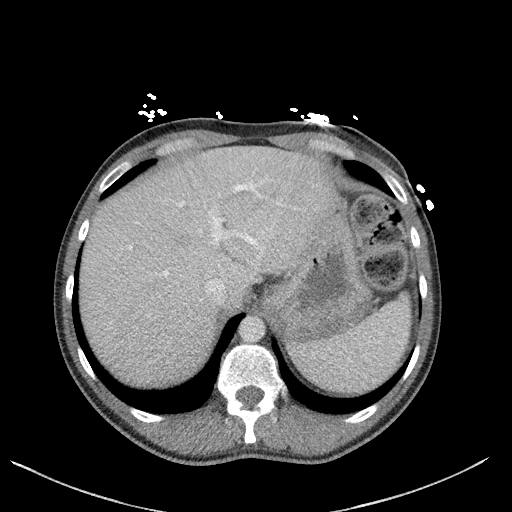
[im 65/83  bone]
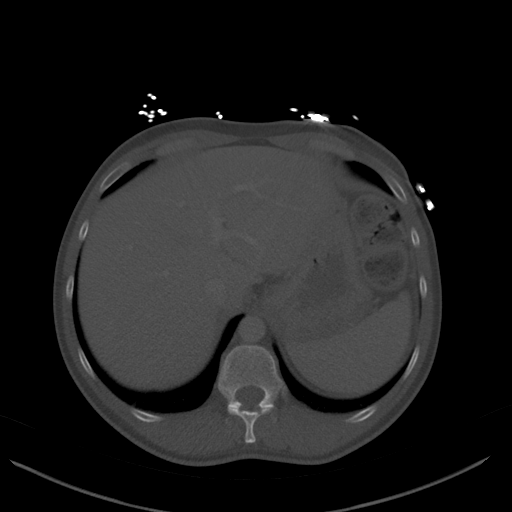
[im 72/83  soft-tissue]
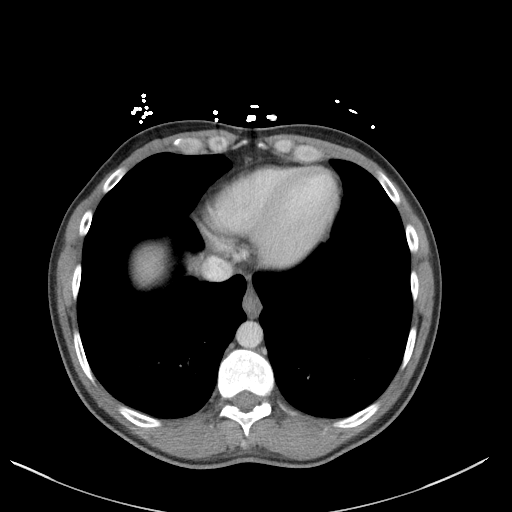
[im 79/83  soft-tissue]
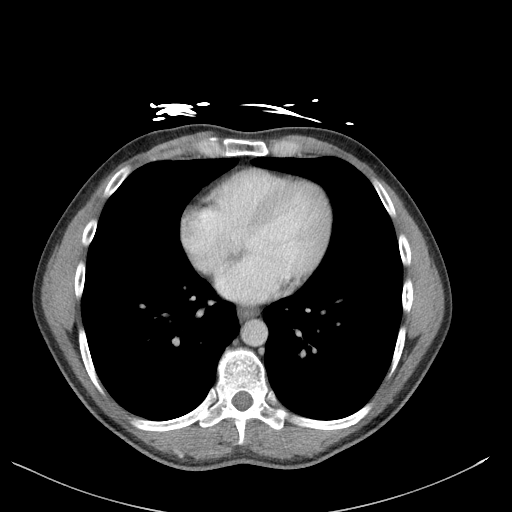

[Series 6: a/p w/ cor · coronal · 0.59mm/px · 3 of 151 slices shown]
[im 51/151  soft-tissue]
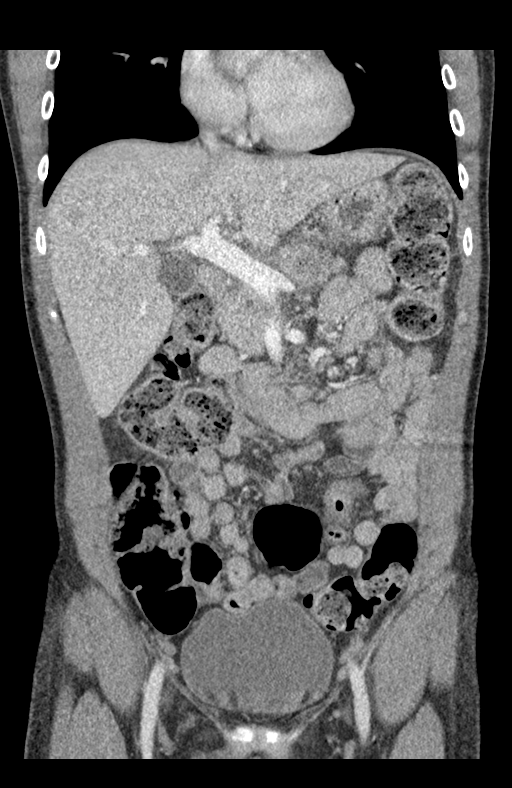
[im 67/151  soft-tissue]
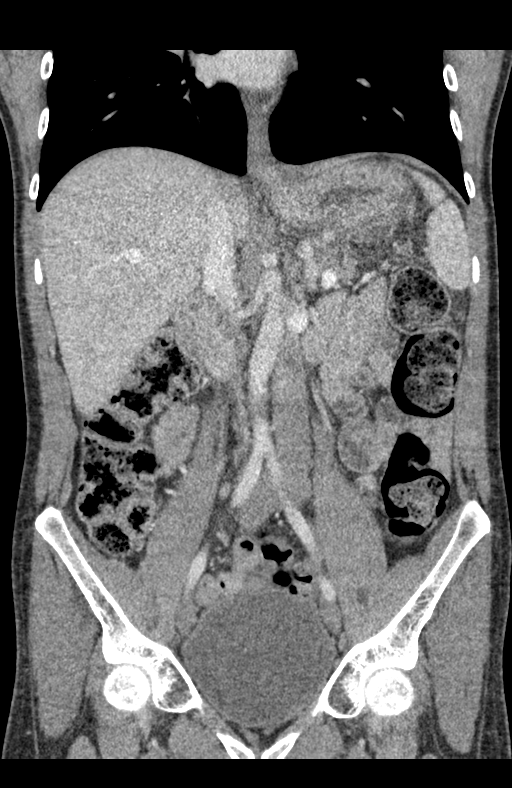
[im 84/151  soft-tissue]
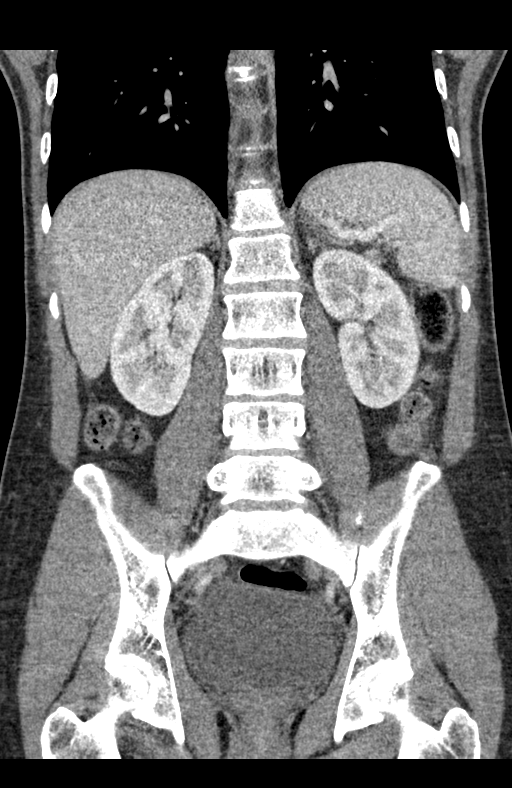

[14 of 46 positions shown; findings below may reference images not displayed]

RADIATION DOSE REDUCTION: This exam was performed according to the
departmental dose-optimization program which includes automated
exposure control, adjustment of the mA and/or kV according to
patient size and/or use of iterative reconstruction technique.

CONTRAST:  70mL OMNIPAQUE IOHEXOL 300 MG/ML  SOLN
FINDINGS: Lower chest: No acute abnormality.

Hepatobiliary: No focal liver abnormality is seen. No gallstones,
gallbladder wall thickening, or biliary dilatation.

Pancreas: Stable appearance of low-attenuation mass within the body
of the pancreas measuring roughly 2.7 cm in diameter and with
associated atrophy and ductal dilatation within the distal body and
tail. The lentiform fluid collection posterior to the stomach in the
lesser sac seen on the 08/22/2021 scan has essentially resolved with
a small amount of edema and fluid remaining. No new fluid
collections identified.

Spleen: Normal in size without focal abnormality.

Adrenals/Urinary Tract: Adrenal glands are unremarkable. Kidneys are
normal, without renal calculi, focal lesion, or hydronephrosis.
Bladder is unremarkable.

Stomach/Bowel: Bowel shows no evidence of obstruction, ileus,
inflammation or lesion. There is moderate stool again noted
throughout most of the colon without significant focal fecal
impaction. No free intraperitoneal air identified.

Vascular/Lymphatic: Stable normal variant persistent left-sided IVC
below the level of the renal veins. No venous thrombosis related to
the pancreatic mass. The mass does abut a segment of the splenic
vein.

Stable mildly prominent peripancreatic and gastrohepatic lymph nodes
again noted suspicious for metastatic involvement. The largest
medial to the pancreatic body and in the celiac/periportal region
measures approximately 13 mm in short axis.

Reproductive: Prostate is unremarkable.

Other: No abdominal wall hernia or abnormality. No abdominopelvic
ascites.

Musculoskeletal: No acute or significant osseous findings.
IMPRESSION: 1. Resolving fluid collection in the lesser sac between the
posterior stomach and spleen noted on the prior scan after recent
endoscopic ultrasound and biopsy of the pancreas. A mild amount of
edema of remains.
2. Stable appearance roughly 3 cm mass of the body of the pancreas
consistent with known adenocarcinoma.
3. Stable mildly enlarged peripancreatic, celiac and gastrohepatic
lymph nodes suspicious for metastatic involvement.
